# Patient Record
Sex: Male | Born: 1950 | Race: White | Hispanic: No | Marital: Married | Smoking: Never smoker
Health system: Southern US, Community
[De-identification: ages and names within clinical notes are randomized; demographics above are authoritative.]

## PROBLEM LIST (undated history)

## (undated) DIAGNOSIS — M1990 Unspecified osteoarthritis, unspecified site: Secondary | ICD-10-CM

## (undated) DIAGNOSIS — T8859XA Other complications of anesthesia, initial encounter: Secondary | ICD-10-CM

## (undated) DIAGNOSIS — G459 Transient cerebral ischemic attack, unspecified: Secondary | ICD-10-CM

## (undated) DIAGNOSIS — G6181 Chronic inflammatory demyelinating polyneuritis: Secondary | ICD-10-CM

## (undated) DIAGNOSIS — I10 Essential (primary) hypertension: Secondary | ICD-10-CM

## (undated) DIAGNOSIS — I2510 Atherosclerotic heart disease of native coronary artery without angina pectoris: Secondary | ICD-10-CM

## (undated) DIAGNOSIS — G4730 Sleep apnea, unspecified: Secondary | ICD-10-CM

## (undated) DIAGNOSIS — R51 Headache: Secondary | ICD-10-CM

## (undated) DIAGNOSIS — I480 Paroxysmal atrial fibrillation: Secondary | ICD-10-CM

## (undated) DIAGNOSIS — Z8719 Personal history of other diseases of the digestive system: Secondary | ICD-10-CM

## (undated) DIAGNOSIS — F99 Mental disorder, not otherwise specified: Secondary | ICD-10-CM

## (undated) DIAGNOSIS — F419 Anxiety disorder, unspecified: Secondary | ICD-10-CM

## (undated) DIAGNOSIS — G40909 Epilepsy, unspecified, not intractable, without status epilepticus: Secondary | ICD-10-CM

## (undated) DIAGNOSIS — Z950 Presence of cardiac pacemaker: Secondary | ICD-10-CM

## (undated) DIAGNOSIS — I639 Cerebral infarction, unspecified: Secondary | ICD-10-CM

## (undated) DIAGNOSIS — E1140 Type 2 diabetes mellitus with diabetic neuropathy, unspecified: Secondary | ICD-10-CM

## (undated) DIAGNOSIS — T4145XA Adverse effect of unspecified anesthetic, initial encounter: Secondary | ICD-10-CM

## (undated) DIAGNOSIS — K219 Gastro-esophageal reflux disease without esophagitis: Secondary | ICD-10-CM

## (undated) HISTORY — PX: INGUINAL HERNIA REPAIR: SHX194

## (undated) HISTORY — PX: OTHER SURGICAL HISTORY: SHX169

## (undated) HISTORY — PX: HERNIA REPAIR: SHX51

## (undated) HISTORY — PX: LUMBAR FUSION: SHX111

## (undated) HISTORY — PX: PERCUTANEOUS PINNING TOE FRACTURE: SUR1018

## (undated) HISTORY — PX: APPENDECTOMY: SHX54

---

## 1999-12-04 HISTORY — PX: CORONARY ARTERY BYPASS GRAFT: SHX141

## 2012-10-15 ENCOUNTER — Other Ambulatory Visit: Payer: Self-pay | Admitting: Neurosurgery

## 2012-10-28 ENCOUNTER — Encounter (HOSPITAL_COMMUNITY): Payer: Self-pay | Admitting: Pharmacy Technician

## 2012-11-03 ENCOUNTER — Ambulatory Visit (HOSPITAL_COMMUNITY)
Admission: RE | Admit: 2012-11-03 | Discharge: 2012-11-03 | Disposition: A | Discharge status: Home / Self Care | Payer: Managed Care, Other (non HMO) | Source: Ambulatory Visit | Attending: Anesthesiology | Admitting: Anesthesiology

## 2012-11-03 ENCOUNTER — Encounter (HOSPITAL_COMMUNITY)
Admission: RE | Admit: 2012-11-03 | Discharge: 2012-11-03 | Disposition: A | Discharge status: Home / Self Care | Payer: Managed Care, Other (non HMO) | Source: Ambulatory Visit | Attending: Neurosurgery | Admitting: Neurosurgery

## 2012-11-03 ENCOUNTER — Encounter (HOSPITAL_COMMUNITY): Payer: Self-pay

## 2012-11-03 DIAGNOSIS — Z01818 Encounter for other preprocedural examination: Secondary | ICD-10-CM | POA: Insufficient documentation

## 2012-11-03 HISTORY — DX: Adverse effect of unspecified anesthetic, initial encounter: T41.45XA

## 2012-11-03 HISTORY — DX: Anxiety disorder, unspecified: F41.9

## 2012-11-03 HISTORY — DX: Personal history of other diseases of the digestive system: Z87.19

## 2012-11-03 HISTORY — DX: Atherosclerotic heart disease of native coronary artery without angina pectoris: I25.10

## 2012-11-03 HISTORY — DX: Gastro-esophageal reflux disease without esophagitis: K21.9

## 2012-11-03 HISTORY — DX: Unspecified osteoarthritis, unspecified site: M19.90

## 2012-11-03 HISTORY — DX: Headache: R51

## 2012-11-03 HISTORY — DX: Other complications of anesthesia, initial encounter: T88.59XA

## 2012-11-03 HISTORY — DX: Mental disorder, not otherwise specified: F99

## 2012-11-03 LAB — CBC
HCT: 43.8 % (ref 39.0–52.0)
Hemoglobin: 15.4 g/dL (ref 13.0–17.0)
RDW: 12.6 % (ref 11.5–15.5)
WBC: 6.5 10*3/uL (ref 4.0–10.5)

## 2012-11-03 LAB — TYPE AND SCREEN
ABO/RH(D): O POS
Antibody Screen: NEGATIVE

## 2012-11-03 LAB — SURGICAL PCR SCREEN
MRSA, PCR: NEGATIVE
Staphylococcus aureus: NEGATIVE

## 2012-11-03 LAB — BASIC METABOLIC PANEL
Chloride: 104 mEq/L (ref 96–112)
GFR calc Af Amer: >90 mL/min (ref >90)
GFR calc non Af Amer: 86 mL/min — ABNORMAL LOW (ref >90)
Potassium: 3.8 mEq/L (ref 3.5–5.1)

## 2012-11-03 NOTE — Progress Notes (Signed)
Pt was seen by Dr Holley Raring in the past 3 weeks.  I faxed a request to [facility] requesting the EKG, office notes and any stress test or 2D echo.

## 2012-11-03 NOTE — Pre-Procedure Instructions (Addendum)
20 JEDI CATALFAMO  11/03/2012   Your procedure is scheduled on:  Monday, December 9th.  Report to [facility] at 5:30 AM.    Call this number if you have problems the morning of surgery: [phone]   Remember: Nothing to eat or drink after Midnight.    Take these medicines the morning of surgery with A SIP OF WATER: Clonazepam (Klonopin), Divalproex (Depakote), Escitalopram (Lexapro).   May take Hydrocodone- Acetaminophen (Norco) if needed.   Stop taking Aspirin,  Coumadin, Plavix, Effient and Herbal medications.  Don not take any NSAIDs ie: Ibuprofen,  Advil,Naproxen or any medication containing Aspirin (Exedrin Migraine).   Do not wear jewelry, make-up or nail polish.  Do not wear lotions, powders, or perfumes. You may wear deodorant.  Do not shave 48 hours prior to surgery. Men may shave face and neck.  Do not bring valuables to the hospital.  Contacts, dentures or bridgework may not be worn into surgery.  Leave suitcase in the car. After surgery it may be brought to your room.  For patients admitted to the hospital, checkout time is 11:00 AM the day of discharge.   Patients discharged the day of surgery will not be allowed to drive home.  Name and phone number of your driver: na    Special Instructions: Shower using CHG 2 nights before surgery and the night before surgery.  If you shower the day of surgery use CHG.  Use special wash - you have one bottle of CHG for all showers.  You should use approximately 1/3 of the bottle for each shower.   Please read over the following fact sheets that you were given: Pain Booklet, Coughing and Deep Breathing, Blood Transfusion Information and Surgical Site Infection Prevention

## 2012-11-09 MED ORDER — DEXAMETHASONE SODIUM PHOSPHATE 10 MG/ML IJ SOLN
10.0000 mg | INTRAMUSCULAR | Status: AC
  Administered 2012-11-10: 10 mg via INTRAVENOUS
  Filled 2012-11-09: qty 1

## 2012-11-09 MED ORDER — CEFAZOLIN SODIUM-DEXTROSE 2-3 GM-% IV SOLR
2.0000 g | INTRAVENOUS | Status: AC
  Administered 2012-11-10: 2 g via INTRAVENOUS
  Filled 2012-11-09: qty 50

## 2012-11-10 ENCOUNTER — Encounter (HOSPITAL_COMMUNITY): Payer: Self-pay | Admitting: Anesthesiology

## 2012-11-10 ENCOUNTER — Ambulatory Visit (HOSPITAL_COMMUNITY): Payer: Medicare Other

## 2012-11-10 ENCOUNTER — Ambulatory Visit (HOSPITAL_COMMUNITY): Payer: Medicare Other | Admitting: Anesthesiology

## 2012-11-10 ENCOUNTER — Encounter (HOSPITAL_COMMUNITY)
Admission: RE | Disposition: A | Discharge status: Home / Self Care | Payer: Self-pay | Source: Ambulatory Visit | Attending: Neurosurgery

## 2012-11-10 ENCOUNTER — Encounter (HOSPITAL_COMMUNITY): Payer: Self-pay | Admitting: *Deleted

## 2012-11-10 ENCOUNTER — Inpatient Hospital Stay (HOSPITAL_COMMUNITY)
Admission: RE | Admit: 2012-11-10 | Discharge: 2012-11-12 | DRG: 460 | Disposition: A | Discharge status: Home / Self Care | Payer: Medicare Other | Source: Ambulatory Visit | Attending: Neurosurgery | Admitting: Neurosurgery

## 2012-11-10 DIAGNOSIS — K219 Gastro-esophageal reflux disease without esophagitis: Secondary | ICD-10-CM | POA: Diagnosis present

## 2012-11-10 DIAGNOSIS — M5137 Other intervertebral disc degeneration, lumbosacral region: Principal | ICD-10-CM | POA: Diagnosis present

## 2012-11-10 DIAGNOSIS — M4316 Spondylolisthesis, lumbar region: Secondary | ICD-10-CM

## 2012-11-10 DIAGNOSIS — Z9861 Coronary angioplasty status: Secondary | ICD-10-CM

## 2012-11-10 DIAGNOSIS — F429 Obsessive-compulsive disorder, unspecified: Secondary | ICD-10-CM | POA: Diagnosis present

## 2012-11-10 DIAGNOSIS — E119 Type 2 diabetes mellitus without complications: Secondary | ICD-10-CM | POA: Diagnosis present

## 2012-11-10 DIAGNOSIS — F411 Generalized anxiety disorder: Secondary | ICD-10-CM | POA: Diagnosis present

## 2012-11-10 DIAGNOSIS — M129 Arthropathy, unspecified: Secondary | ICD-10-CM | POA: Diagnosis present

## 2012-11-10 DIAGNOSIS — Z7982 Long term (current) use of aspirin: Secondary | ICD-10-CM

## 2012-11-10 DIAGNOSIS — I2510 Atherosclerotic heart disease of native coronary artery without angina pectoris: Secondary | ICD-10-CM | POA: Diagnosis present

## 2012-11-10 DIAGNOSIS — Z79899 Other long term (current) drug therapy: Secondary | ICD-10-CM

## 2012-11-10 DIAGNOSIS — M51379 Other intervertebral disc degeneration, lumbosacral region without mention of lumbar back pain or lower extremity pain: Principal | ICD-10-CM | POA: Diagnosis present

## 2012-11-10 DIAGNOSIS — M4310 Spondylolisthesis, site unspecified: Secondary | ICD-10-CM | POA: Diagnosis present

## 2012-11-10 DIAGNOSIS — Z7902 Long term (current) use of antithrombotics/antiplatelets: Secondary | ICD-10-CM

## 2012-11-10 DIAGNOSIS — Z951 Presence of aortocoronary bypass graft: Secondary | ICD-10-CM

## 2012-11-10 LAB — GLUCOSE, CAPILLARY
Glucose-Capillary: 105 mg/dL — ABNORMAL HIGH (ref 70–99)
Glucose-Capillary: 154 mg/dL — ABNORMAL HIGH (ref 70–99)

## 2012-11-10 SURGERY — POSTERIOR LUMBAR FUSION 1 LEVEL
Anesthesia: General | Site: Back | Laterality: Bilateral | Wound class: Clean

## 2012-11-10 MED ORDER — EPHEDRINE SULFATE 50 MG/ML IJ SOLN
INTRAMUSCULAR | Status: DC | PRN
  Administered 2012-11-10: 5 mg via INTRAVENOUS

## 2012-11-10 MED ORDER — ONDANSETRON HCL 4 MG/2ML IJ SOLN
INTRAMUSCULAR | Status: DC | PRN
  Administered 2012-11-10: 4 mg via INTRAVENOUS

## 2012-11-10 MED ORDER — CLONAZEPAM 1 MG PO TABS
2.0000 mg | ORAL_TABLET | Freq: Two times a day (BID) | ORAL | Status: DC
  Administered 2012-11-10 – 2012-11-12 (×4): 2 mg via ORAL
  Filled 2012-11-10 (×4): qty 2

## 2012-11-10 MED ORDER — DEXAMETHASONE SODIUM PHOSPHATE 4 MG/ML IJ SOLN
4.0000 mg | Freq: Four times a day (QID) | INTRAMUSCULAR | Status: AC
  Administered 2012-11-10 (×2): 4 mg via INTRAVENOUS
  Filled 2012-11-10: qty 1

## 2012-11-10 MED ORDER — 0.9 % SODIUM CHLORIDE (POUR BTL) OPTIME
TOPICAL | Status: DC | PRN
  Administered 2012-11-10: 1000 mL

## 2012-11-10 MED ORDER — PANTOPRAZOLE SODIUM 40 MG IV SOLR
40.0000 mg | Freq: Every day | INTRAVENOUS | Status: DC
  Administered 2012-11-10: 40 mg via INTRAVENOUS
  Filled 2012-11-10 (×2): qty 40

## 2012-11-10 MED ORDER — GLYCOPYRROLATE 0.2 MG/ML IJ SOLN
INTRAMUSCULAR | Status: DC | PRN
  Administered 2012-11-10: 0.4 mg via INTRAVENOUS
  Administered 2012-11-10: 0.3 mg via INTRAVENOUS

## 2012-11-10 MED ORDER — ACETAMINOPHEN 650 MG RE SUPP: 650.0000 mg | RECTAL | Status: DC | PRN

## 2012-11-10 MED ORDER — SODIUM CHLORIDE 0.9 % IJ SOLN: 3.0000 mL | INTRAMUSCULAR | Status: DC | PRN

## 2012-11-10 MED ORDER — ROCURONIUM BROMIDE 100 MG/10ML IV SOLN
INTRAVENOUS | Status: DC | PRN
  Administered 2012-11-10 (×3): 10 mg via INTRAVENOUS
  Administered 2012-11-10: 50 mg via INTRAVENOUS
  Administered 2012-11-10 (×3): 10 mg via INTRAVENOUS

## 2012-11-10 MED ORDER — BACITRACIN 50000 UNITS IM SOLR
INTRAMUSCULAR | Status: AC
  Filled 2012-11-10: qty 1

## 2012-11-10 MED ORDER — DIVALPROEX SODIUM 500 MG PO DR TAB
750.0000 mg | DELAYED_RELEASE_TABLET | Freq: Two times a day (BID) | ORAL | Status: DC
  Administered 2012-11-10 – 2012-11-12 (×4): 750 mg via ORAL
  Filled 2012-11-10 (×6): qty 1

## 2012-11-10 MED ORDER — MIDAZOLAM HCL 5 MG/5ML IJ SOLN
INTRAMUSCULAR | Status: DC | PRN
  Administered 2012-11-10 (×2): 1 mg via INTRAVENOUS

## 2012-11-10 MED ORDER — BACITRACIN ZINC 500 UNIT/GM EX OINT: TOPICAL_OINTMENT | CUTANEOUS | Status: DC | PRN

## 2012-11-10 MED ORDER — KCL IN DEXTROSE-NACL 20-5-0.45 MEQ/L-%-% IV SOLN
80.0000 mL/h | INTRAVENOUS | Status: DC
  Administered 2012-11-10: 80 mL/h via INTRAVENOUS
  Filled 2012-11-10 (×5): qty 1000

## 2012-11-10 MED ORDER — HYDROMORPHONE HCL PF 1 MG/ML IJ SOLN
1.0000 mg | INTRAMUSCULAR | Status: DC | PRN
  Administered 2012-11-10: 1 mg via INTRAMUSCULAR
  Filled 2012-11-10: qty 1

## 2012-11-10 MED ORDER — BUPIVACAINE HCL (PF) 0.5 % IJ SOLN
INTRAMUSCULAR | Status: DC | PRN
  Administered 2012-11-10: 30 mL

## 2012-11-10 MED ORDER — NEOSTIGMINE METHYLSULFATE 1 MG/ML IJ SOLN
INTRAMUSCULAR | Status: DC | PRN
  Administered 2012-11-10: 2 mg via INTRAVENOUS
  Administered 2012-11-10: 3 mg via INTRAVENOUS

## 2012-11-10 MED ORDER — THROMBIN 20000 UNITS EX SOLR
CUTANEOUS | Status: DC | PRN
  Administered 2012-11-10: 08:00:00 via TOPICAL

## 2012-11-10 MED ORDER — HYDROMORPHONE HCL PF 1 MG/ML IJ SOLN
INTRAMUSCULAR | Status: AC
  Filled 2012-11-10: qty 1

## 2012-11-10 MED ORDER — ATORVASTATIN CALCIUM 10 MG PO TABS
10.0000 mg | ORAL_TABLET | Freq: Every day | ORAL | Status: DC
  Administered 2012-11-10 – 2012-11-11 (×2): 10 mg via ORAL
  Filled 2012-11-10 (×3): qty 1

## 2012-11-10 MED ORDER — ARTIFICIAL TEARS OP OINT
TOPICAL_OINTMENT | OPHTHALMIC | Status: DC | PRN
  Administered 2012-11-10: 1 via OPHTHALMIC

## 2012-11-10 MED ORDER — SODIUM CHLORIDE 0.9 % IR SOLN
Status: DC | PRN
  Administered 2012-11-10: 08:00:00

## 2012-11-10 MED ORDER — MENTHOL 3 MG MT LOZG: 1.0000 | LOZENGE | OROMUCOSAL | Status: DC | PRN

## 2012-11-10 MED ORDER — HYDROCODONE-ACETAMINOPHEN 5-325 MG PO TABS
1.0000 | ORAL_TABLET | ORAL | Status: DC | PRN
  Administered 2012-11-10: 2 via ORAL
  Administered 2012-11-11: 1 via ORAL
  Administered 2012-11-11 (×3): 2 via ORAL
  Administered 2012-11-12: 1 via ORAL
  Administered 2012-11-12 (×2): 2 via ORAL
  Filled 2012-11-10 (×8): qty 2

## 2012-11-10 MED ORDER — ACETAMINOPHEN 325 MG PO TABS: 650.0000 mg | ORAL_TABLET | ORAL | Status: DC | PRN

## 2012-11-10 MED ORDER — SODIUM CHLORIDE 0.9 % IV SOLN
INTRAVENOUS | Status: AC
  Filled 2012-11-10: qty 500

## 2012-11-10 MED ORDER — OXYCODONE HCL 5 MG/5ML PO SOLN: 5.0000 mg | Freq: Once | ORAL | Status: DC | PRN

## 2012-11-10 MED ORDER — HYDROMORPHONE HCL PF 1 MG/ML IJ SOLN
0.2500 mg | INTRAMUSCULAR | Status: DC | PRN
  Administered 2012-11-10 (×6): 0.5 mg via INTRAVENOUS

## 2012-11-10 MED ORDER — CEFAZOLIN SODIUM-DEXTROSE 2-3 GM-% IV SOLR
2.0000 g | Freq: Three times a day (TID) | INTRAVENOUS | Status: AC
  Administered 2012-11-10 – 2012-11-11 (×3): 2 g via INTRAVENOUS
  Filled 2012-11-10 (×3): qty 50

## 2012-11-10 MED ORDER — HYDROMORPHONE HCL PF 1 MG/ML IJ SOLN
INTRAMUSCULAR | Status: AC
  Filled 2012-11-10: qty 2

## 2012-11-10 MED ORDER — PHENYLEPHRINE HCL 10 MG/ML IJ SOLN
10.0000 mg | INTRAVENOUS | Status: DC | PRN
  Administered 2012-11-10: 20 ug/min via INTRAVENOUS

## 2012-11-10 MED ORDER — DEXAMETHASONE 4 MG PO TABS
4.0000 mg | ORAL_TABLET | Freq: Four times a day (QID) | ORAL | Status: AC
  Filled 2012-11-10 (×2): qty 1

## 2012-11-10 MED ORDER — ALBUMIN HUMAN 5 % IV SOLN
INTRAVENOUS | Status: DC | PRN
  Administered 2012-11-10: 10:00:00 via INTRAVENOUS

## 2012-11-10 MED ORDER — LACTATED RINGERS IV SOLN
INTRAVENOUS | Status: DC | PRN
  Administered 2012-11-10 (×2): via INTRAVENOUS

## 2012-11-10 MED ORDER — LIDOCAINE HCL (CARDIAC) 20 MG/ML IV SOLN
INTRAVENOUS | Status: DC | PRN
  Administered 2012-11-10: 100 mg via INTRAVENOUS

## 2012-11-10 MED ORDER — OXYCODONE HCL 5 MG PO TABS: 5.0000 mg | ORAL_TABLET | Freq: Once | ORAL | Status: DC | PRN

## 2012-11-10 MED ORDER — PROPOFOL 10 MG/ML IV BOLUS
INTRAVENOUS | Status: DC | PRN
  Administered 2012-11-10: 200 mg via INTRAVENOUS

## 2012-11-10 MED ORDER — PHENOL 1.4 % MT LIQD: 1.0000 | OROMUCOSAL | Status: DC | PRN

## 2012-11-10 MED ORDER — SODIUM CHLORIDE 0.9 % IV SOLN: 250.0000 mL | INTRAVENOUS | Status: DC

## 2012-11-10 MED ORDER — FENTANYL CITRATE 0.05 MG/ML IJ SOLN
INTRAMUSCULAR | Status: DC | PRN
  Administered 2012-11-10: 25 ug via INTRAVENOUS
  Administered 2012-11-10: 50 ug via INTRAVENOUS
  Administered 2012-11-10: 25 ug via INTRAVENOUS
  Administered 2012-11-10: 150 ug via INTRAVENOUS

## 2012-11-10 MED ORDER — DEXTROSE 5 % IV SOLN
INTRAVENOUS | Status: DC | PRN
  Administered 2012-11-10: 08:00:00 via INTRAVENOUS

## 2012-11-10 MED ORDER — PROMETHAZINE HCL 25 MG/ML IJ SOLN: 6.2500 mg | INTRAMUSCULAR | Status: DC | PRN

## 2012-11-10 MED ORDER — ONDANSETRON HCL 4 MG/2ML IJ SOLN: 4.0000 mg | INTRAMUSCULAR | Status: DC | PRN

## 2012-11-10 MED ORDER — ESCITALOPRAM OXALATE 10 MG PO TABS
10.0000 mg | ORAL_TABLET | Freq: Every morning | ORAL | Status: DC
  Administered 2012-11-11 – 2012-11-12 (×2): 10 mg via ORAL
  Filled 2012-11-10 (×2): qty 1

## 2012-11-10 MED ORDER — SODIUM CHLORIDE 0.9 % IJ SOLN
3.0000 mL | Freq: Two times a day (BID) | INTRAMUSCULAR | Status: DC
  Administered 2012-11-11 – 2012-11-12 (×3): 3 mL via INTRAVENOUS

## 2012-11-10 SURGICAL SUPPLY — 66 items
BAG DECANTER FOR FLEXI CONT (MISCELLANEOUS) ×2 IMPLANT
BENZOIN TINCTURE PRP APPL 2/3 (GAUZE/BANDAGES/DRESSINGS) ×2 IMPLANT
BLADE SURG ROTATE 9660 (MISCELLANEOUS) ×2 IMPLANT
BONE EQUIVA 10CC (Bone Implant) ×2 IMPLANT
BRUSH SCRUB EZ PLAIN DRY (MISCELLANEOUS) ×2 IMPLANT
CANISTER SUCTION 2500CC (MISCELLANEOUS) ×2 IMPLANT
CLOTH BEACON ORANGE TIMEOUT ST (SAFETY) ×2 IMPLANT
CONT SPEC 4OZ CLIKSEAL STRL BL (MISCELLANEOUS) ×4 IMPLANT
COVER BACK TABLE 24X17X13 BIG (DRAPES) IMPLANT
COVER TABLE BACK 60X90 (DRAPES) ×2 IMPLANT
DERMABOND ADVANCED (GAUZE/BANDAGES/DRESSINGS) ×1
DERMABOND ADVANCED .7 DNX12 (GAUZE/BANDAGES/DRESSINGS) ×1 IMPLANT
DRAPE C-ARM 42X72 X-RAY (DRAPES) ×6 IMPLANT
DRAPE LAPAROTOMY 100X72X124 (DRAPES) ×2 IMPLANT
DRAPE SURG 17X23 STRL (DRAPES) ×4 IMPLANT
DRESSING TELFA 8X3 (GAUZE/BANDAGES/DRESSINGS) ×2 IMPLANT
DURAPREP 26ML APPLICATOR (WOUND CARE) ×2 IMPLANT
ELECT REM PT RETURN 9FT ADLT (ELECTROSURGICAL) ×2
ELECTRODE REM PT RTRN 9FT ADLT (ELECTROSURGICAL) ×1 IMPLANT
EVACUATOR 1/8 PVC DRAIN (DRAIN) ×2 IMPLANT
GAUZE SPONGE 4X4 16PLY XRAY LF (GAUZE/BANDAGES/DRESSINGS) ×2 IMPLANT
GLOVE BIO SURGEON STRL SZ8.5 (GLOVE) ×2 IMPLANT
GLOVE BIOGEL PI IND STRL 6.5 (GLOVE) ×2 IMPLANT
GLOVE BIOGEL PI INDICATOR 6.5 (GLOVE) ×2
GLOVE ECLIPSE 7.5 STRL STRAW (GLOVE) ×4 IMPLANT
GLOVE EXAM NITRILE LRG STRL (GLOVE) IMPLANT
GLOVE EXAM NITRILE MD LF STRL (GLOVE) ×4 IMPLANT
GLOVE EXAM NITRILE XS STR PU (GLOVE) IMPLANT
GLOVE SS BIOGEL STRL SZ 8 (GLOVE) ×1 IMPLANT
GLOVE SUPERSENSE BIOGEL SZ 8 (GLOVE) ×1
GLOVE SURG SS PI 6.5 STRL IVOR (GLOVE) ×8 IMPLANT
GOWN BRE IMP SLV AUR LG STRL (GOWN DISPOSABLE) IMPLANT
GOWN BRE IMP SLV AUR XL STRL (GOWN DISPOSABLE) ×10 IMPLANT
GOWN STRL REIN 2XL LVL4 (GOWN DISPOSABLE) IMPLANT
KIT BASIN OR (CUSTOM PROCEDURE TRAY) ×2 IMPLANT
KIT ROOM TURNOVER OR (KITS) ×2 IMPLANT
LUCENT ST 10X22X9 (Cage) ×4 IMPLANT
LUCENT STRAIGHT CAGE 10X22X9MM IMPLANT
MARKER SKIN DUAL TIP RULER LAB (MISCELLANEOUS) ×2 IMPLANT
NEEDLE HYPO 22GX1.5 SAFETY (NEEDLE) ×4 IMPLANT
NS IRRIG 1000ML POUR BTL (IV SOLUTION) ×2 IMPLANT
PACK LAMINECTOMY NEURO (CUSTOM PROCEDURE TRAY) ×2 IMPLANT
PAD ARMBOARD 7.5X6 YLW CONV (MISCELLANEOUS) ×6 IMPLANT
PATTIES SURGICAL .75X.75 (GAUZE/BANDAGES/DRESSINGS) IMPLANT
ROD PER BENT 35MM (Rod) ×4 IMPLANT
SCREW POLY 6.5X40MM (Screw) ×2 IMPLANT
SCREW POLY 6.5X45 (Screw) ×1 IMPLANT
SCREW POLY 6.5X45MM (Screw) ×1 IMPLANT
SCREW SEQUOIA 6.5X35MM (Screw) ×4 IMPLANT
SPONGE GAUZE 4X4 12PLY (GAUZE/BANDAGES/DRESSINGS) ×2 IMPLANT
SPONGE LAP 4X18 X RAY DECT (DISPOSABLE) IMPLANT
SPONGE SURGIFOAM ABS GEL 100 (HEMOSTASIS) ×2 IMPLANT
STRIP CLOSURE SKIN 1/2X4 (GAUZE/BANDAGES/DRESSINGS) ×2 IMPLANT
SUT VIC AB 0 CT1 18XCR BRD8 (SUTURE) ×1 IMPLANT
SUT VIC AB 0 CT1 8-18 (SUTURE) ×1
SUT VIC AB 2-0 OS6 18 (SUTURE) ×6 IMPLANT
SUT VIC AB 3-0 CP2 18 (SUTURE) ×2 IMPLANT
SYR 20ML ECCENTRIC (SYRINGE) ×2 IMPLANT
TOOL FLUTED BALL 7MM (MISCELLANEOUS) ×2 IMPLANT
TOOL MATCHSTK 3MM (MISCELLANEOUS) ×2 IMPLANT
TOP CLSR SEQUOIA (Orthopedic Implant) ×8 IMPLANT
TOWEL OR 17X24 6PK STRL BLUE (TOWEL DISPOSABLE) ×2 IMPLANT
TOWEL OR 17X26 10 PK STRL BLUE (TOWEL DISPOSABLE) ×2 IMPLANT
TRAP SPECIMEN MUCOUS 40CC (MISCELLANEOUS) ×2 IMPLANT
TRAY FOLEY CATH 14FRSI W/METER (CATHETERS) ×2 IMPLANT
WATER STERILE IRR 1000ML POUR (IV SOLUTION) ×2 IMPLANT

## 2012-11-10 NOTE — Preoperative (Signed)
Beta Blockers   Reason not to administer Beta Blockers:Not Applicable 

## 2012-11-10 NOTE — H&P (Signed)
Edward Arellano is an 61 y.o. male.   Chief Complaint: Back and left leg pain HPI: The patient is 61 year old gentleman who presented with back and left lower extremity discomfort. He was tried on aggressive conservative therapy which gave her no relief. 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 After failing additional conservative therapy the patient requested surgery and now comes for an L5-S1 posterior lumbar interbody fusion with pedicle screw fixation. I had a long discussion with him regarding the risks and benefits of surgical intervention. The risks discussed include but are not limited to bleeding infection weakness numbness paralysis spinal fluid leak trouble with instrumentation nonunion coma and death. We have discussed alternative methods of therapy offered risks and benefits of nonintervention. He has had the opportunity to ask numerous questions and appears to understand. With this information in hand he has requested to proceed with surgery.  Past Medical History  Diagnosis Date  . Complication of anesthesia     Takes alot to sedate patient.  . Coronary artery disease   . Diabetes mellitus without complication     A1C 5.0  . Anxiety   . H/O hiatal hernia     corrected  . GERD (gastroesophageal reflux disease)     corrected with Hital Hernia  . Headache     sees a neuroologist- Dr. Celine Mans  . Arthritis   . Mental disorder     OCD.       Past Surgical History  Procedure Date  . Appendectomy   . Hernia repair   . Coronary artery bypass graft 2001  . Inguinal hernia repair     Right and Left  . Colonscopy   . Percutaneous pinning toe fracture     right 5th toe    History reviewed. No pertinent family history. Social History:  reports that he has never smoked. He does not have any smokeless tobacco history on file. He reports that he does not drink alcohol or use illicit  drugs.  Allergies: No Known Allergies  Medications Prior to Admission  Medication Sig Dispense Refill  . acetaminophen (TYLENOL) 650 MG CR tablet Take 650 mg by mouth 2 (two) times daily as needed. For pain      . aspirin-acetaminophen-caffeine (EXCEDRIN MIGRAINE) 250-250-65 MG per tablet Take 1 tablet by mouth every 6 (six) hours as needed. For pain      . clonazePAM (KLONOPIN) 1 MG tablet Take 2 mg by mouth 2 (two) times daily. Name brand only      . divalproex (DEPAKOTE) 500 MG DR tablet Take 750 mg by mouth 2 (two) times daily.      [name] escitalopram (LEXAPRO) 10 MG tablet Take 10 mg by mouth every morning.      . folic acid (FOLVITE) 1 MG tablet Take 1 mg by mouth every morning.      [name] HYDROcodone-acetaminophen (NORCO) 7.5-325 MG per tablet Take 2 tablets by mouth 2 (two) times daily.      . Multiple Vitamin (MULTIVITAMIN WITH MINERALS) TABS Take 1 tablet by mouth every morning.      . niacin 500 MG CR capsule Take 1,500 mg by mouth at bedtime.      . rosuvastatin (CRESTOR) 10 MG tablet Take 10 mg by mouth every evening.      [name] aspirin EC 81 MG tablet Take 81 mg by mouth every morning.      . clopidogrel (PLAVIX) 75 MG tablet Take  75 mg by mouth every morning.        Results for orders placed during the hospital encounter of 11/10/12 (from the past 48 hour(s))  GLUCOSE, CAPILLARY     Status: Abnormal   Collection Time   11/10/12  6:20 AM      Component Value Range Comment   Glucose-Capillary 105 (*) 70 - 99 mg/dL    No results found.  A comprehensive review of systems was negative.  Blood pressure 139/85, pulse 58, temperature 97.6 F (36.4 C), temperature source Oral, resp. rate 18, SpO2 99.00%.  The patient is awake alert and oriented. His no facial asymmetry. His gait is slow but nonantalgic. His reflexes are decreased but equal. He has normal strength and sensation. 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 The plan is  for an L5-S1 decompression with interbody fusion and pedicle screw fixation.  Reinaldo Meeker, MD 11/10/2012, 7:37 AM

## 2012-11-10 NOTE — Anesthesia Procedure Notes (Signed)
Procedure Name: Intubation Date/Time: 11/10/2012 7:49 AM Performed by: Neomia Dear, Osvaldo Lamping K Pre-anesthesia Checklist: Patient being monitored, Suction available, Emergency Drugs available, Patient identified and Timeout performed Patient Re-evaluated:Patient Re-evaluated prior to inductionOxygen Delivery Method: Circle system utilized Preoxygenation: Pre-oxygenation with 100% oxygen Intubation Type: IV induction Ventilation: Mask ventilation without difficulty Laryngoscope Size: Mac and 3 Grade View: Grade I Tube type: Oral Tube size: 8.5 mm Number of attempts: 1 Airway Equipment and Method: Stylet Placement Confirmation: ETT inserted through vocal cords under direct vision,  breath sounds checked- equal and bilateral and positive ETCO2 Secured at: 24 cm Tube secured with: Tape Dental Injury: Teeth and Oropharynx as per pre-operative assessment

## 2012-11-10 NOTE — Anesthesia Preprocedure Evaluation (Addendum)
Anesthesia Evaluation  Patient identified by MRN, date of birth, ID band Patient awake    Reviewed: Allergy & Precautions, H&P , NPO status , Patient's Chart, lab work & pertinent test results  Airway Mallampati: II TM Distance: >3 FB Neck ROM: Full    Dental  (+) Teeth Intact and Dental Advisory Given   Pulmonary neg pulmonary ROS,    Pulmonary exam normal       Cardiovascular + CAD, + Cardiac Stents and + CABG     Neuro/Psych  Headaches, PSYCHIATRIC DISORDERS Anxiety    GI/Hepatic Neg liver ROS, hiatal hernia, GERD-  Controlled,  Endo/Other  neg diabetes  Renal/GU negative Renal ROS     Musculoskeletal negative musculoskeletal ROS (+)   Abdominal   Peds  Hematology negative hematology ROS (+)   Anesthesia Other Findings   Reproductive/Obstetrics                         Anesthesia Physical Anesthesia Plan  ASA: III  Anesthesia Plan: General   Post-op Pain Management:    Induction: Intravenous  Airway Management Planned: Oral ETT  Additional Equipment:   Intra-op Plan:   Post-operative Plan: Extubation in OR  Informed Consent: I have reviewed the patients History and Physical, chart, labs and discussed the procedure including the risks, benefits and alternatives for the proposed anesthesia with the patient or authorized representative who has indicated his/her understanding and acceptance.   Dental advisory given  Plan Discussed with: Anesthesiologist, Surgeon and CRNA  Anesthesia Plan Comments:        Anesthesia Quick Evaluation

## 2012-11-10 NOTE — Op Note (Signed)
Preop diagnosis: Spondylolisthesis with stenosis L5-S1 with L5 and S1 nerve root compression bilaterally Postop diagnosis: Same Procedure: Bilateral decompressive L5-S1 laminectomy with decompression of L5 and S1 nerve roots more so than needed for interbody fusion followed by L5-S1 posterior lumbar interbody fusion with peek interbody spacers followed by L5-S1 posterolateral fusion followed by L5-S1 nonsegmental instrumentation with Sequoia pedicle screw system Surgeon: Regis Wiland Assistant: Lovell Sheehan  Abdomen place the prone position the patient's back was prepped and draped in the usual sterile fashion. Localizing x-ray was taken prior to incision to identify the appropriate level. Midline incision was made above the spinous processes of L4-L5 and S1. Using the Bovie cutting current the incision was carried on the spinous processes. Subperiosteal dissection was then carried out bilaterally on the spinous processes lamina and facet joint and to the far lateral region to identify the far lateral aspect of the sacrum and the transverse process of L5. Self-retaining tract was placed for exposure and x-ray showed approach the appropriate level. Using the Leksell rongeur spinous process of L5 and S1 removed. Starting the patient's left side generous laminotomy was performed removing the inferior 80% of the L5 lamina the medial three quarters of the facet joint. Pars defects were identified and the a free piece of bony overgrowth was removed to decompress the L5 and S1 nerve root more so than needed for interbody fusion. Summary compression was then carried out on the opposite side. Once again the pannus of bone was removed to decompress the L5 and S1 nerve roots. At this time inspection was carried out in all directions the evidence of residual compression and none could be identified. The disc space was then slowly distracted up to a 9 mm size. This was quite difficult as the bones were basically in contact prior to  this distraction. We are able to get a 9 mm a spacer and on the first side. We then did the same on the opposite side decorticated the endplates. We placed a bony shavings and morselized allograft in the midline to help with interbody fusion and then placed a second space without difficulty. Proximal we showed the spacers to be in good position. We then placed pedicle screws with Sequoia pedicle screw system. We drilled entry point then passed the pedicle all followed by tapping with a 5.5 mm tap and placed 6.5 x 40 mm screws at L5 and 6.5 x 35 Miller screws at S1. We then decorticated the far lateral region and placed a mixture of morselized allograft and autologous bone help with a posterior lateral fusion. We then chose appropriate length rod and secured to the top of the screws and then did tightening and final tightening with torque and counter torque. Final fluoroscopy AP lateral direction looked excellent. We then irrigated the wound copiously metabolic irrigation controlled any bleeding with upper coagulation Gelfoam. Left an epidural drain in the epidural space and brought out through separate stab incision. The was then closed in multiple layers of Vicryl on the muscle fascia subcutaneous and subcuticular tissues. Dermabond Steri-Strips were placed on the skin. Shortness was then applied the patient was extubated to recovery in stable condition.

## 2012-11-10 NOTE — Transfer of Care (Signed)
Immediate Anesthesia Transfer of Care Note  Patient: Edward Arellano  Procedure(s) Performed: Procedure(s) (LRB) with comments: POSTERIOR LUMBAR FUSION 1 LEVEL (Bilateral) - Lumbar five sacral one Posterior lumbar interbody fusion with pedicle screws  Patient Location: PACU  Anesthesia Type:General  Level of Consciousness: awake and oriented  Airway & Oxygen Therapy: Patient Spontanous Breathing and Patient connected to nasal cannula oxygen  Post-op Assessment: Report given to PACU RN, Post -op Vital signs reviewed and stable and Patient moving all extremities  Post vital signs: Reviewed and stable  Complications: No apparent anesthesia complications

## 2012-11-10 NOTE — Anesthesia Postprocedure Evaluation (Signed)
Anesthesia Post Note  Patient: Edward Arellano  Procedure(s) Performed: Procedure(s) (LRB): POSTERIOR LUMBAR FUSION 1 LEVEL (Bilateral)  Anesthesia type: general  Patient location: PACU  Post pain: Pain level controlled  Post assessment: Patient's Cardiovascular Status Stable  Last Vitals:  Filed Vitals:   11/10/12 1215  BP:   Pulse: 97  Temp:   Resp: 16    Post vital signs: Reviewed and stable  Level of consciousness: sedated  Complications: No apparent anesthesia complications

## 2012-11-11 LAB — GLUCOSE, CAPILLARY
Glucose-Capillary: 149 mg/dL — ABNORMAL HIGH (ref 70–99)
Glucose-Capillary: 153 mg/dL — ABNORMAL HIGH (ref 70–99)
Glucose-Capillary: 135 mg/dL — ABNORMAL HIGH (ref 70–99)

## 2012-11-11 MED ORDER — INFLUENZA VIRUS VACC SPLIT PF IM SUSP
0.5000 mL | INTRAMUSCULAR | Status: AC
  Administered 2012-11-12: 0.5 mL via INTRAMUSCULAR
  Filled 2012-11-11: qty 0.5

## 2012-11-11 MED ORDER — PANTOPRAZOLE SODIUM 40 MG PO TBEC
40.0000 mg | DELAYED_RELEASE_TABLET | Freq: Every day | ORAL | Status: DC
  Administered 2012-11-11 – 2012-11-12 (×2): 40 mg via ORAL
  Filled 2012-11-11 (×2): qty 1

## 2012-11-11 MED ORDER — DOCUSATE SODIUM 100 MG PO CAPS
100.0000 mg | ORAL_CAPSULE | Freq: Two times a day (BID) | ORAL | Status: DC
  Administered 2012-11-11 – 2012-11-12 (×2): 100 mg via ORAL
  Filled 2012-11-11 (×2): qty 1

## 2012-11-11 NOTE — Progress Notes (Signed)
Hemovac removed @ 1600. Pt tolerated well.  Site clean dry intact with minimal bleeding.

## 2012-11-11 NOTE — Progress Notes (Signed)
Patient walked early morning with a walker. Tolerated well. Foley d/cd

## 2012-11-11 NOTE — Progress Notes (Signed)
Patient ID: Edward Arellano, male   DOB: 21-Sep-1951, 61 y.o.   MRN: [id number] Subjective: Patient reports incisional; pain only  Objective: Vital signs in last 24 hours: Temp:  [97.7 F (36.5 C)-98.2 F (36.8 C)] 97.7 F (36.5 C) (12/10 1000) Pulse Rate:  [83-105] 83  (12/10 1000) Resp:  [17-20] 17  (12/10 1000) BP: (103-120)/(62-80) 117/80 mmHg (12/10 1000) SpO2:  [94 %-96 %] 94 % (12/10 1000) Weight:  [103.42 kg (228 lb)] 103.42 kg (228 lb) (12/10 4098)  Intake/Output from previous day: 12/09 0701 - 12/10 0700 In: 3169.3 [P.O.:360; I.V.:2559.3; IV Piggyback:250] Out: 3150 [Urine:2550; Drains:250; Blood:350] Intake/Output this shift: Total I/O In: -  Out: 475 [Urine:475]  Wound:clean and dry  Lab Results: No results found for this basename: WBC:2,HGB:2,HCT:2,PLT:2 in the last 72 hours BMET No results found for this basename: NA:2,K:2,CL:2,CO2:2,GLUCOSE:2,BUN:2,CREATININE:2,CALCIUM:2 in the last 72 hours  Studies/Results: Dg Lumbar Spine 2-3 Views  11/10/2012  *RADIOLOGY REPORT*  Clinical Data: Low back pain.  DG C-ARM 61-120 MIN,LUMBAR SPINE - 2-3 VIEW  Comparison: None.  Findings: C-arm films document pedicle screw and interbody cage placement in preparation for L5-S1 fusion.  No adverse features.  IMPRESSION: As above.   Original Report Authenticated By: Davonna Belling, M.D.    Dg C-arm 61-120 Min  11/10/2012  *RADIOLOGY REPORT*  Clinical Data: Low back pain.  DG C-ARM 61-120 MIN,LUMBAR SPINE - 2-3 VIEW  Comparison: None.  Findings: C-arm films document pedicle screw and interbody cage placement in preparation for L5-S1 fusion.  No adverse features.  IMPRESSION: As above.   Original Report Authenticated By: Davonna Belling, M.D.     Assessment/Plan: Doing well POD 1. Increasing activity. Will continue preswent rx.  LOS: 1 day  as above   Reinaldo Meeker, MD 11/11/2012, 2:36 PM

## 2012-11-11 NOTE — Progress Notes (Signed)
Pt foley d/c at 0600. Pt voided 225cc at 1015 and has no c/o urge to void or voiding problems.  Will continue to monitor.

## 2012-11-11 NOTE — Progress Notes (Signed)
Ambulated pt with walker and brace around the unit x 2.  Pt tolerated well. Resting now with call bell within reach.

## 2012-11-12 MED ORDER — HYDROCODONE-ACETAMINOPHEN 5-325 MG PO TABS: 1.0000 | ORAL_TABLET | ORAL | Status: DC | PRN

## 2012-11-12 NOTE — Discharge Summary (Signed)
Physician Discharge Summary  Patient ID: CARSTEN CARSTARPHEN MRN: [id number] DOB/AGE: Jul 18, 1951 61 y.o.  Admit date: 11/10/2012 Discharge date: 11/12/2012  Admission Diagnoses:  Discharge Diagnoses:  Active Problems:  * No active hospital problems. *    Discharged Condition: good  Hospital Course: Surgery Mopnday. Did well. Pain much improved. Increased activity well. Wound fine. Home POD 2. Specific instructions given.  Consults: None  Significant Diagnostic Studies: none  Treatments: surgery: L5 S1 PLIF  Discharge Exam: Blood pressure 106/66, pulse 62, temperature 97.5 F (36.4 C), temperature source Oral, resp. rate 18, height 6\' 2"  (1.88 m), weight 103.42 kg (228 lb), SpO2 97.00%. Incision/Wound:healing well  Disposition: Final discharge disposition not confirmed  Discharge Orders    Future Orders Please Complete By Expires   Diet general      Discharge instructions      Comments:   Mostly bedrest. Get up 9 or 10 times each day and walk for 15-20 minutes each time. Very little sitting the first week. No riding in the car until your first post op appointment. If you had neck surgery...may shower from the chest down. If you had low back surgery....you may shower with a saran wrap covering over the incision. Take your pain medicine as needed...and other medicines that you are instructed to take. Call for an appointment...[phone].   Call MD for:  temperature >100.4      Call MD for:  persistant nausea and vomiting      Call MD for:  severe uncontrolled pain      Call MD for:  redness, tenderness, or signs of infection (pain, swelling, redness, odor or green/yellow discharge around incision site)      Call MD for:  difficulty breathing, headache or visual disturbances      Call MD for:  hives          Medication List     As of 11/12/2012 10:01 AM    STOP taking these medications         aspirin EC 81 MG tablet      aspirin-acetaminophen-caffeine 250-250-65 MG per  tablet   Commonly known as: EXCEDRIN MIGRAINE      clopidogrel 75 MG tablet   Commonly known as: PLAVIX      folic acid 1 MG tablet   Commonly known as: FOLVITE      HYDROcodone-acetaminophen 7.5-325 MG per tablet   Commonly known as: NORCO      TAKE these medications         acetaminophen 650 MG CR tablet   Commonly known as: TYLENOL   Take 650 mg by mouth 2 (two) times daily as needed. For pain      divalproex 500 MG DR tablet   Commonly known as: DEPAKOTE   Take 750 mg by mouth 2 (two) times daily.      escitalopram 10 MG tablet   Commonly known as: LEXAPRO   Take 10 mg by mouth every morning.      HYDROcodone-acetaminophen 5-325 MG per tablet   Commonly known as: NORCO/VICODIN   Take 1-2 tablets by mouth every 4 (four) hours as needed.      KLONOPIN 1 MG tablet   Generic drug: clonazePAM   Take 2 mg by mouth 2 (two) times daily. Name brand only      multivitamin with minerals Tabs   Take 1 tablet by mouth every morning.      niacin 500 MG CR capsule   Take 1,500 mg by  mouth at bedtime.      rosuvastatin 10 MG tablet   Commonly known as: CRESTOR   Take 10 mg by mouth every evening.         At home rest most of the time. Get up 9 or 10 times each day and take a 15 or 20 minute walk. No riding in the car and to your first postoperative appointment. If you have neck surgery you may shower from the chest down starting on the third postoperative day. If you had back surgery he may start showering on the third postoperative day with saran wrap wrapped around your incisional area 3 times. After the shower remove the saran wrap. Take pain medicine as needed and other medications as instructed. Call my office for an appointment.  SignedReinaldo Meeker, MD 11/12/2012, 10:01 AM

## 2012-11-12 NOTE — Care Management Note (Signed)
    Page 1 of 1   11/12/2012     12:33:32 PM   CARE MANAGEMENT NOTE 11/12/2012  Patient:  Edward Arellano, Edward Arellano   Account Number:  [id number]  Date Initiated:  11/10/2012  Documentation initiated by:  Jacquelynn Cree  Subjective/Objective Assessment:   Admitted postop L1-S5 PLIF     Action/Plan:   plan return home   Anticipated DC Date:  11/13/2012   Anticipated DC Plan:  HOME/SELF CARE      DC Planning Services  CM consult      Choice offered to / List presented to:             Status of service:  Completed, signed off Medicare Important Message given?   (If response is "NO", the following Medicare IM given date fields will be blank) Date Medicare IM given:   Date Additional Medicare IM given:    Discharge Disposition:  HOME/SELF CARE  Per UR Regulation:  Reviewed for med. necessity/level of care/duration of stay  If discussed at Long Length of Stay Meetings, dates discussed:    Comments:

## 2013-11-24 HISTORY — PX: PACEMAKER PLACEMENT: SHX43

## 2013-11-24 IMAGING — CR DG CHEST 2V
2 series · 2 of 2 positions shown · non-contrast
Comparison: None

CLINICAL DATA: Preop lumbar fusion

CHEST - 2 VIEW

[view not recorded (1 of 2)]
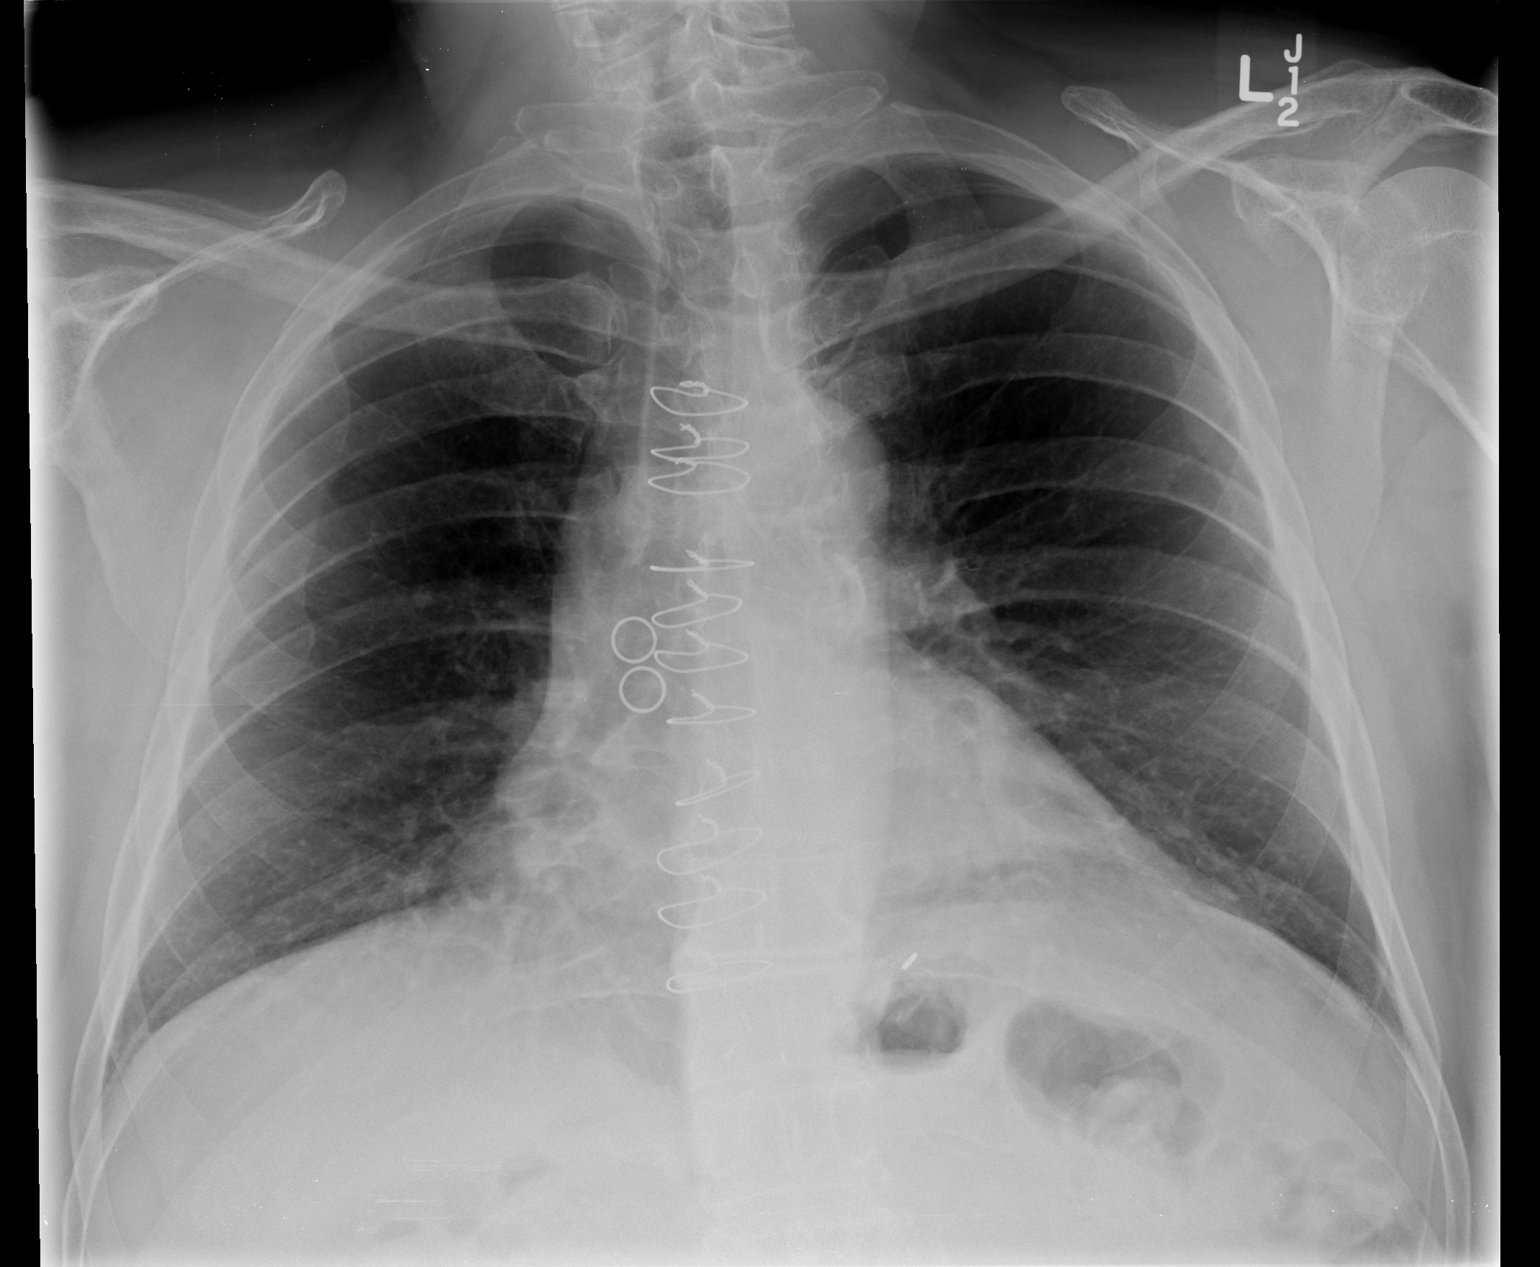

[view not recorded (2 of 2)]
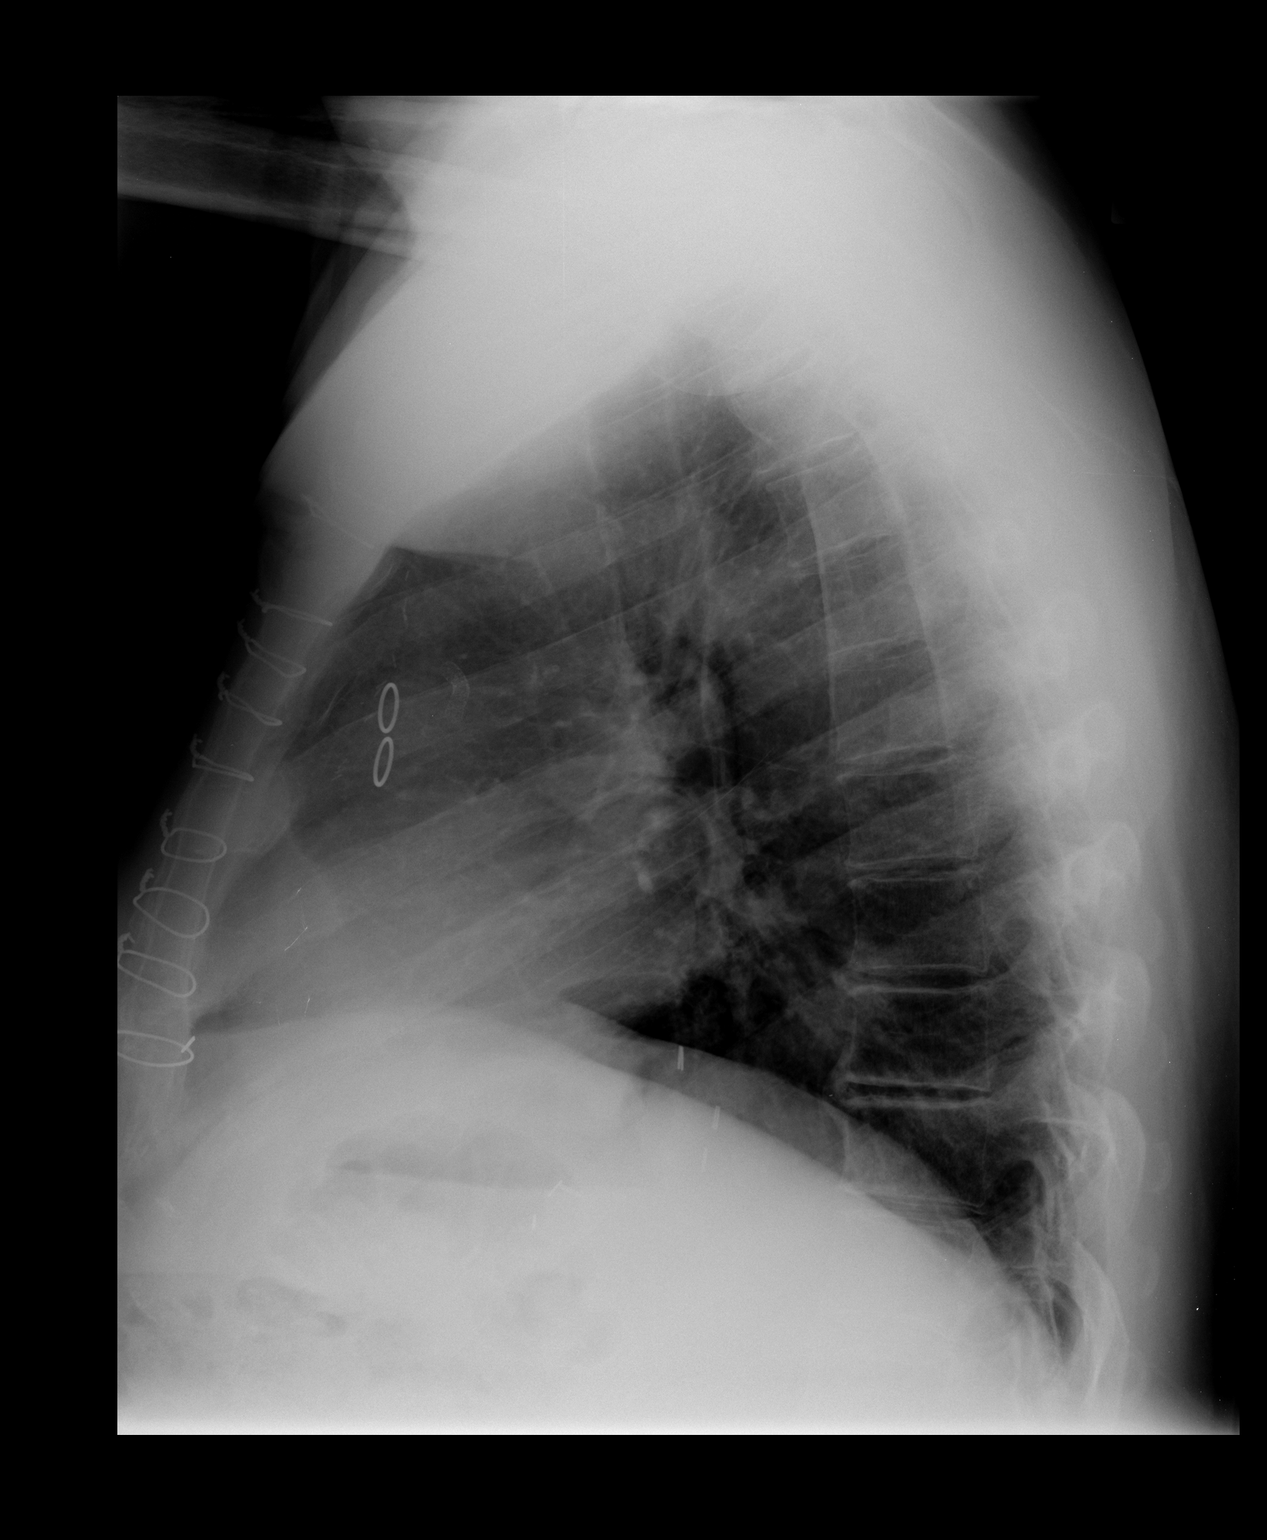

[2 of 2 positions shown; findings below may reference images not displayed]

FINDINGS: Previous median sternotomy and coronary artery bypass
grafting.

Heart size is normal.  No pleural effusion or edema.  Lung volumes
are low.

No airspace consolidation identified.
IMPRESSION: 1.  No acute cardiopulmonary abnormalities.

## 2013-12-01 IMAGING — RF DG LUMBAR SPINE 2-3V
1 series · 2 of 2 positions shown · non-contrast
Comparison: None.

CLINICAL DATA: Low back pain.

DG C-ARM 61-120 MIN,LUMBAR SPINE - 2-3 VIEW

[Series 1: run · 2 of 2 slices shown]
[im 1/2]
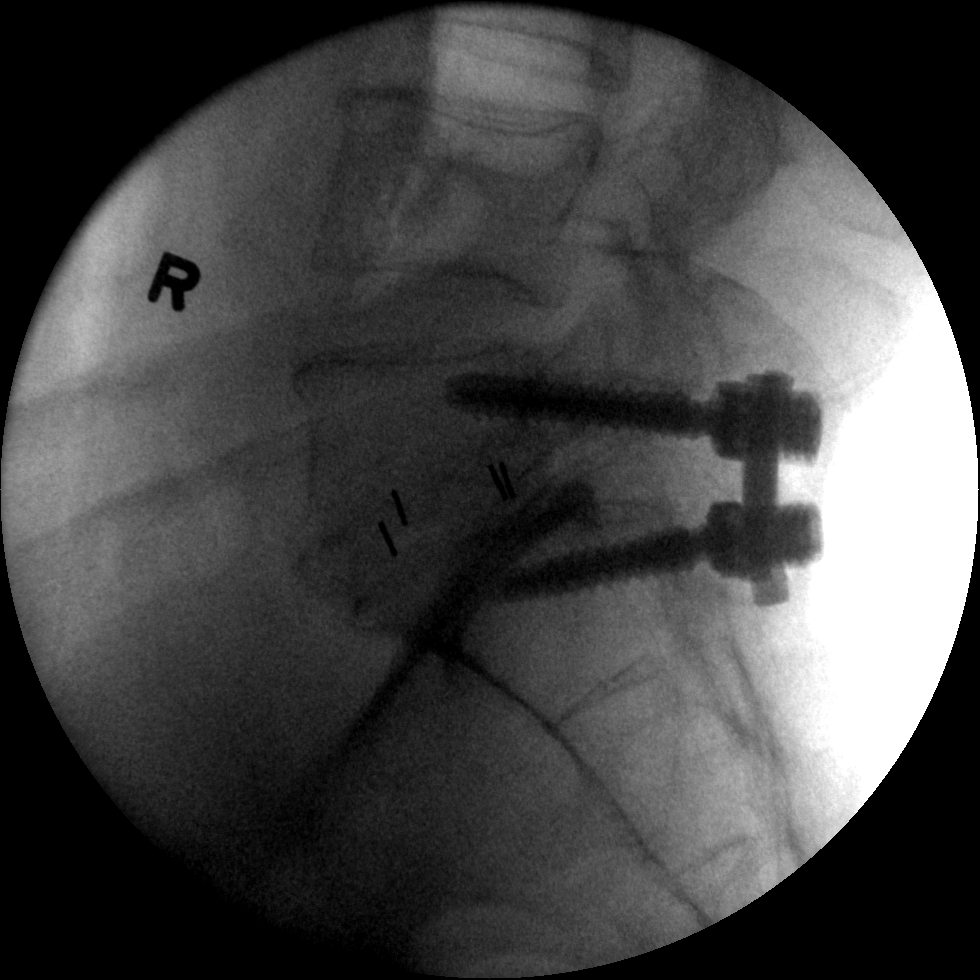
[im 2/2]
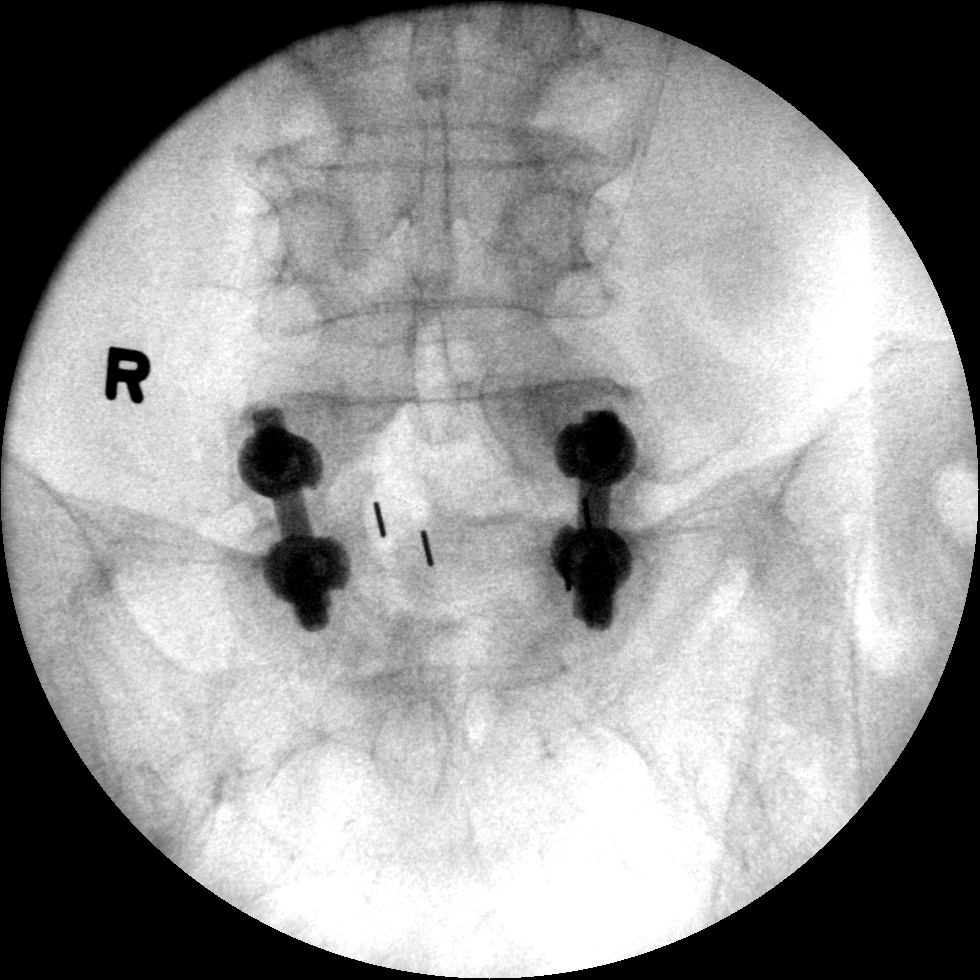

[2 of 2 positions shown; findings below may reference images not displayed]

FINDINGS: C-arm films document pedicle screw and interbody cage
placement in preparation for L5-S1 fusion.  No adverse features.
IMPRESSION: As above.

## 2016-12-21 ENCOUNTER — Observation Stay (HOSPITAL_COMMUNITY): Payer: Medicare Other

## 2016-12-21 ENCOUNTER — Emergency Department (HOSPITAL_COMMUNITY): Payer: Medicare Other

## 2016-12-21 ENCOUNTER — Encounter (HOSPITAL_COMMUNITY): Payer: Self-pay | Admitting: Emergency Medicine

## 2016-12-21 ENCOUNTER — Inpatient Hospital Stay (HOSPITAL_COMMUNITY)
Admission: EM | Admit: 2016-12-21 | Discharge: 2016-12-22 | DRG: 313 | Disposition: A | Discharge status: Home / Self Care | Payer: Medicare Other | Attending: Family Medicine | Admitting: Family Medicine

## 2016-12-21 DIAGNOSIS — E782 Mixed hyperlipidemia: Secondary | ICD-10-CM

## 2016-12-21 DIAGNOSIS — M542 Cervicalgia: Secondary | ICD-10-CM

## 2016-12-21 DIAGNOSIS — E785 Hyperlipidemia, unspecified: Secondary | ICD-10-CM | POA: Diagnosis present

## 2016-12-21 DIAGNOSIS — I4819 Other persistent atrial fibrillation: Secondary | ICD-10-CM

## 2016-12-21 DIAGNOSIS — H8112 Benign paroxysmal vertigo, left ear: Secondary | ICD-10-CM

## 2016-12-21 DIAGNOSIS — F429 Obsessive-compulsive disorder, unspecified: Secondary | ICD-10-CM | POA: Diagnosis present

## 2016-12-21 DIAGNOSIS — Z7901 Long term (current) use of anticoagulants: Secondary | ICD-10-CM

## 2016-12-21 DIAGNOSIS — R0789 Other chest pain: Secondary | ICD-10-CM | POA: Diagnosis present

## 2016-12-21 DIAGNOSIS — I480 Paroxysmal atrial fibrillation: Secondary | ICD-10-CM | POA: Diagnosis present

## 2016-12-21 DIAGNOSIS — E669 Obesity, unspecified: Secondary | ICD-10-CM | POA: Diagnosis present

## 2016-12-21 DIAGNOSIS — F419 Anxiety disorder, unspecified: Secondary | ICD-10-CM | POA: Diagnosis present

## 2016-12-21 DIAGNOSIS — H8110 Benign paroxysmal vertigo, unspecified ear: Secondary | ICD-10-CM

## 2016-12-21 DIAGNOSIS — K219 Gastro-esophageal reflux disease without esophagitis: Secondary | ICD-10-CM | POA: Diagnosis present

## 2016-12-21 DIAGNOSIS — Z8673 Personal history of transient ischemic attack (TIA), and cerebral infarction without residual deficits: Secondary | ICD-10-CM | POA: Diagnosis not present

## 2016-12-21 DIAGNOSIS — I10 Essential (primary) hypertension: Secondary | ICD-10-CM

## 2016-12-21 DIAGNOSIS — R55 Syncope and collapse: Secondary | ICD-10-CM | POA: Diagnosis present

## 2016-12-21 DIAGNOSIS — E872 Acidosis: Secondary | ICD-10-CM | POA: Diagnosis present

## 2016-12-21 DIAGNOSIS — I2510 Atherosclerotic heart disease of native coronary artery without angina pectoris: Secondary | ICD-10-CM | POA: Diagnosis present

## 2016-12-21 DIAGNOSIS — R079 Chest pain, unspecified: Secondary | ICD-10-CM | POA: Diagnosis present

## 2016-12-21 DIAGNOSIS — G4733 Obstructive sleep apnea (adult) (pediatric): Secondary | ICD-10-CM | POA: Diagnosis present

## 2016-12-21 DIAGNOSIS — E1140 Type 2 diabetes mellitus with diabetic neuropathy, unspecified: Secondary | ICD-10-CM | POA: Diagnosis present

## 2016-12-21 DIAGNOSIS — Z888 Allergy status to other drugs, medicaments and biological substances status: Secondary | ICD-10-CM

## 2016-12-21 DIAGNOSIS — Z6830 Body mass index (BMI) 30.0-30.9, adult: Secondary | ICD-10-CM | POA: Diagnosis not present

## 2016-12-21 DIAGNOSIS — Z951 Presence of aortocoronary bypass graft: Secondary | ICD-10-CM

## 2016-12-21 DIAGNOSIS — Z950 Presence of cardiac pacemaker: Secondary | ICD-10-CM | POA: Diagnosis not present

## 2016-12-21 DIAGNOSIS — I25700 Atherosclerosis of coronary artery bypass graft(s), unspecified, with unstable angina pectoris: Secondary | ICD-10-CM

## 2016-12-21 HISTORY — DX: Essential (primary) hypertension: I10

## 2016-12-21 HISTORY — DX: Paroxysmal atrial fibrillation: I48.0

## 2016-12-21 HISTORY — DX: Presence of cardiac pacemaker: Z95.0

## 2016-12-21 HISTORY — DX: Type 2 diabetes mellitus with diabetic neuropathy, unspecified: E11.40

## 2016-12-21 HISTORY — DX: Sleep apnea, unspecified: G47.30

## 2016-12-21 HISTORY — DX: Transient cerebral ischemic attack, unspecified: G45.9

## 2016-12-21 LAB — BASIC METABOLIC PANEL
Anion gap: 8 (ref 5–15)
BUN: 8 mg/dL (ref 6–20)
CHLORIDE: 108 mmol/L (ref 101–111)
CO2: 28 mmol/L (ref 22–32)
CREATININE: 0.9 mg/dL (ref 0.61–1.24)
Calcium: 9.1 mg/dL (ref 8.9–10.3)
GFR calc non Af Amer: >60 mL/min (ref >60)
Glucose, Bld: 97 mg/dL (ref 65–99)
Potassium: 4.3 mmol/L (ref 3.5–5.1)
Sodium: 144 mmol/L (ref 135–145)

## 2016-12-21 LAB — URINALYSIS, ROUTINE W REFLEX MICROSCOPIC
Bilirubin Urine: NEGATIVE
GLUCOSE, UA: NEGATIVE mg/dL
Hgb urine dipstick: NEGATIVE
Ketones, ur: NEGATIVE mg/dL
LEUKOCYTES UA: NEGATIVE
Nitrite: NEGATIVE
PROTEIN: NEGATIVE mg/dL
SPECIFIC GRAVITY, URINE: 1.015 (ref 1.005–1.030)
pH: 6 (ref 5.0–8.0)

## 2016-12-21 LAB — I-STAT TROPONIN, ED: Troponin i, poc: 0.01 ng/mL (ref 0.00–0.08)

## 2016-12-21 LAB — GLUCOSE, CAPILLARY
GLUCOSE-CAPILLARY: 140 mg/dL — AB (ref 65–99)
GLUCOSE-CAPILLARY: 161 mg/dL — AB (ref 65–99)

## 2016-12-21 LAB — CBC
HCT: 46.2 % (ref 39.0–52.0)
Hemoglobin: 15.9 g/dL (ref 13.0–17.0)
MCH: 31.9 pg (ref 26.0–34.0)
MCHC: 34.4 g/dL (ref 30.0–36.0)
MCV: 92.8 fL (ref 78.0–100.0)
PLATELETS: 128 10*3/uL — AB (ref 150–400)
RBC: 4.98 MIL/uL (ref 4.22–5.81)
RDW: 14 % (ref 11.5–15.5)
WBC: 4.7 10*3/uL (ref 4.0–10.5)

## 2016-12-21 LAB — I-STAT CG4 LACTIC ACID, ED: Lactic Acid, Venous: 3.09 mmol/L (ref 0.5–1.9)

## 2016-12-21 LAB — LACTIC ACID, PLASMA
Lactic Acid, Venous: 2.9 mmol/L (ref 0.5–1.9)
Lactic Acid, Venous: 2.8 mmol/L (ref 0.5–1.9)

## 2016-12-21 LAB — TROPONIN I

## 2016-12-21 MED ORDER — DIVALPROEX SODIUM 500 MG PO DR TAB
750.0000 mg | DELAYED_RELEASE_TABLET | Freq: Three times a day (TID) | ORAL | Status: DC
  Administered 2016-12-21 – 2016-12-22 (×3): 750 mg via ORAL
  Filled 2016-12-21 (×3): qty 1

## 2016-12-21 MED ORDER — ACETAMINOPHEN 650 MG RE SUPP: 650.0000 mg | RECTAL | Status: DC | PRN

## 2016-12-21 MED ORDER — CLONAZEPAM 1 MG PO TABS: 1.0000 mg | ORAL_TABLET | Freq: Two times a day (BID) | ORAL | Status: DC

## 2016-12-21 MED ORDER — ACETAMINOPHEN 325 MG PO TABS: 650.0000 mg | ORAL_TABLET | ORAL | Status: DC | PRN

## 2016-12-21 MED ORDER — PREGABALIN 75 MG PO CAPS
75.0000 mg | ORAL_CAPSULE | Freq: Two times a day (BID) | ORAL | Status: DC
  Administered 2016-12-21 – 2016-12-22 (×2): 75 mg via ORAL
  Filled 2016-12-21 (×2): qty 1

## 2016-12-21 MED ORDER — LOSARTAN POTASSIUM 25 MG PO TABS
25.0000 mg | ORAL_TABLET | Freq: Every day | ORAL | Status: DC
  Administered 2016-12-22: 25 mg via ORAL
  Filled 2016-12-21 (×2): qty 1

## 2016-12-21 MED ORDER — CLONAZEPAM 0.5 MG PO TABS: 2.0000 mg | ORAL_TABLET | Freq: Two times a day (BID) | ORAL | Status: DC

## 2016-12-21 MED ORDER — ACETAMINOPHEN 160 MG/5ML PO SOLN: 650.0000 mg | ORAL | Status: DC | PRN

## 2016-12-21 MED ORDER — SODIUM CHLORIDE 0.9 % IV SOLN
INTRAVENOUS | Status: DC
  Administered 2016-12-21: 22:00:00 via INTRAVENOUS

## 2016-12-21 MED ORDER — VITAMIN D 1000 UNITS PO TABS
2000.0000 [IU] | ORAL_TABLET | Freq: Every day | ORAL | Status: DC
  Administered 2016-12-21 – 2016-12-22 (×2): 2000 [IU] via ORAL
  Filled 2016-12-21 (×2): qty 2

## 2016-12-21 MED ORDER — ROSUVASTATIN CALCIUM 10 MG PO TABS
10.0000 mg | ORAL_TABLET | Freq: Every evening | ORAL | Status: DC
  Administered 2016-12-21: 10 mg via ORAL
  Filled 2016-12-21: qty 1

## 2016-12-21 MED ORDER — ESCITALOPRAM OXALATE 10 MG PO TABS
10.0000 mg | ORAL_TABLET | Freq: Every morning | ORAL | Status: DC
  Administered 2016-12-21 – 2016-12-22 (×2): 10 mg via ORAL
  Filled 2016-12-21 (×2): qty 1

## 2016-12-21 MED ORDER — SENNOSIDES-DOCUSATE SODIUM 8.6-50 MG PO TABS: 1.0000 | ORAL_TABLET | Freq: Every evening | ORAL | Status: DC | PRN

## 2016-12-21 MED ORDER — HEPARIN (PORCINE) IN NACL 100-0.45 UNIT/ML-% IJ SOLN
1450.0000 [IU]/h | INTRAMUSCULAR | Status: DC
  Administered 2016-12-21: 1450 [IU]/h via INTRAVENOUS
  Filled 2016-12-21: qty 250

## 2016-12-21 MED ORDER — ARIPIPRAZOLE 5 MG PO TABS
5.0000 mg | ORAL_TABLET | Freq: Every evening | ORAL | Status: DC | PRN
  Filled 2016-12-21: qty 1

## 2016-12-21 MED ORDER — BUPROPION HCL ER (SR) 100 MG PO TB12
100.0000 mg | ORAL_TABLET | Freq: Every day | ORAL | Status: DC
  Administered 2016-12-21 – 2016-12-22 (×2): 100 mg via ORAL
  Filled 2016-12-21 (×2): qty 1

## 2016-12-21 MED ORDER — CLONAZEPAM 1 MG PO TABS
1.0000 mg | ORAL_TABLET | Freq: Two times a day (BID) | ORAL | Status: DC
  Administered 2016-12-21 – 2016-12-22 (×2): 1 mg via ORAL

## 2016-12-21 MED ORDER — ASPIRIN 81 MG PO CHEW
81.0000 mg | CHEWABLE_TABLET | Freq: Once | ORAL | Status: DC
  Filled 2016-12-21: qty 1

## 2016-12-21 MED ORDER — PANTOPRAZOLE SODIUM 40 MG PO TBEC
80.0000 mg | DELAYED_RELEASE_TABLET | Freq: Every day | ORAL | Status: DC
  Administered 2016-12-22: 80 mg via ORAL
  Filled 2016-12-21: qty 2

## 2016-12-21 MED ORDER — STROKE: EARLY STAGES OF RECOVERY BOOK
Freq: Once | Status: DC
  Filled 2016-12-21: qty 1

## 2016-12-21 MED ORDER — CLONAZEPAM 0.5 MG PO TABS
1.0000 mg | ORAL_TABLET | Freq: Two times a day (BID) | ORAL | Status: DC
  Filled 2016-12-21: qty 2

## 2016-12-21 NOTE — ED Provider Notes (Signed)
[facility]-EMERGENCY DEPT Provider Note   CSN: [id number] Arrival date & time:        History   Chief Complaint Chief Complaint  Patient presents with  . Chest Pain    HPI Edward Arellano is a 66 y.o. male.  HPI   Patient is a 66 year old male with a comp located past medical history. Patient not usually seen at this hospital, usually sent to [facility]. patient's history is significant for syncope, brady/tachycardia syndrome, paroxysmal A. fib, CABG 4, neuropathy, somatoform disorder, TIA.  He is presenting today with sharp chest pain. Patient has had intermittent sharp chest pain since this morning. It radiates down his left arm. Not associated with shortness of breath. Not associated with exertion. Patient has pacemaker. He reports he had a sharp pain and then passed out.  Patient reports he is not had any chest pain along. Time.  Past Medical History:  Diagnosis Date  . Anxiety   . Arthritis   . Complication of anesthesia    Takes alot to sedate patient.  . Coronary artery disease   . Diabetes mellitus without complication (HCC)    A1C 5.0  . GERD (gastroesophageal reflux disease)    corrected with Hital Hernia  . H/O hiatal hernia    corrected  . WUJWJXBJ(478.2Headache(784.0)    sees a neuroologist- Dr. Celine Mansebra Kirby  . Mental disorder    OCD.       Patient Active Problem List   Diagnosis Date Noted  . Chest pain 12/21/2016    Past Surgical History:  Procedure Laterality Date  . APPENDECTOMY    . Colonscopy    . CORONARY ARTERY BYPASS GRAFT  2001  . HERNIA REPAIR    . INGUINAL HERNIA REPAIR     Right and Left  . PERCUTANEOUS PINNING TOE FRACTURE     right 5th toe       Home Medications    Prior to Admission medications   Medication Sig Start Date End Date Taking? Authorizing Provider  acetaminophen (TYLENOL) 650 MG CR tablet Take 650 mg by mouth 2 (two) times daily as needed. For pain    Historical Provider, MD  clonazePAM (KLONOPIN) 1 MG tablet Take 2  mg by mouth 2 (two) times daily. Name brand only    Historical Provider, MD  divalproex (DEPAKOTE) 500 MG DR tablet Take 750 mg by mouth 2 (two) times daily.    Historical Provider, MD  escitalopram (LEXAPRO) 10 MG tablet Take 10 mg by mouth every morning.    Historical Provider, MD  HYDROcodone-acetaminophen (NORCO/VICODIN) 5-325 MG per tablet Take 1-2 tablets by mouth every 4 (four) hours as needed. 11/12/12   Aliene Beamsandy Kritzer, MD  Multiple Vitamin (MULTIVITAMIN WITH MINERALS) TABS Take 1 tablet by mouth every morning.    Historical Provider, MD  niacin 500 MG CR capsule Take 1,500 mg by mouth at bedtime.    Historical Provider, MD  rosuvastatin (CRESTOR) 10 MG tablet Take 10 mg by mouth every evening.    Historical Provider, MD    Family History History reviewed. No pertinent family history.  Social History Social History  Substance Use Topics  . Smoking status: Never Smoker  . Smokeless tobacco: Never Used  . Alcohol use No     Allergies   Nitroglycerin; Cyclobenzaprine; Niacin; and Tizanidine   Review of Systems Review of Systems  Constitutional: Negative for fatigue and fever.  Respiratory: Negative for cough and shortness of breath.   Cardiovascular: Positive for chest pain.  Gastrointestinal: Negative for abdominal pain.  Genitourinary: Negative for flank pain.  Musculoskeletal: Negative for back pain.  Neurological: Positive for dizziness and syncope.  All other systems reviewed and are negative.    Physical Exam Updated Vital Signs BP 125/82   Pulse 71   Temp 98.1 F (36.7 C) (Oral)   Resp 17   SpO2 92%   Physical Exam  Constitutional: He is oriented to person, place, and time. He appears well-nourished.  HENT:  Head: Normocephalic.  Eyes: Conjunctivae and EOM are normal. Pupils are equal, round, and reactive to light.  Cardiovascular: Normal rate.   Pulmonary/Chest: Effort normal and breath sounds normal.  Scar from pacemaker placement.  Abdominal: Soft.  Bowel sounds are normal. There is no tenderness.  Neurological: He is oriented to person, place, and time. No cranial nerve deficit. Coordination normal.  Patient has slow deliberate speech, which appears baseline.  Equal strength bilaterally upper and lower extremities negative pronator drift. Normal sensation bilaterally. Speech comprehensible, no slurring. Facial nerve tested and appears grossly normal. Alert and oriented 3.   Skin: Skin is warm and dry. He is not diaphoretic.  Psychiatric: He has a normal mood and affect. His behavior is normal.     ED Treatments / Results  Labs (all labs ordered are listed, but only abnormal results are displayed) Labs Reviewed  CBC - Abnormal; Notable for the following:       Result Value   Platelets 128 (*)    All other components within normal limits  I-STAT CG4 LACTIC ACID, ED - Abnormal; Notable for the following:    Lactic Acid, Venous 3.09 (*)    All other components within normal limits  BASIC METABOLIC PANEL  URINALYSIS, ROUTINE W REFLEX MICROSCOPIC  I-STAT TROPOININ, ED  I-STAT TROPOININ, ED    EKG  EKG Interpretation  Date/Time:  Friday December 21 2016 10:01:45 EST Ventricular Rate:  64 PR Interval:    QRS Duration: 108 QT Interval:  424 QTC Calculation: 438 R Axis:   -26 Text Interpretation:  Atrial-paced complexes Borderline left axis deviation Baseline wander in lead(s) V2 no STEMI noted Confirmed by Kandis Mannan ([id number]) on 12/21/2016 10:11:52 AM       Radiology Dg Chest 2 View  Result Date: 12/21/2016 CLINICAL DATA:  Left chest pain radiating into the left arm today. EXAM: CHEST  2 VIEW COMPARISON:  PA and lateral chest 12/04/2016 and 01/05/2016. FINDINGS: The patient is status post CABG with a pacing device in place. Heart size is upper normal. Lungs are clear. No pneumothorax or pleural fluid. No acute bony abnormality. IMPRESSION: No acute disease. Electronically Signed   By: Drusilla Kanner M.D.   On:  12/21/2016 10:52    Procedures Procedures (including critical care time)  Medications Ordered in ED Medications - No data to display   Initial Impression / Assessment and Plan / ED Course  I have reviewed the triage vital signs and the nursing notes.  Pertinent labs & imaging results that were available during my care of the patient were reviewed by me and considered in my medical decision making (see chart for details).     Patient's history is significant for syncope, brady/tachycardia syndrome, paroxysmal A. fib, CABG 4, neuropathy, somatoform disorder, TIA.  Patient here with chest pain sharp, not pleuritic. Radiating down left arm. Given patient's past medical history or CABG, elevated heart score, will require admission for chest pain rule out. Doubt PE, doubt aortic dissection.   Symptoms are mildly concerning  for ischemic heart disease.  Patient also had syncope, patient has pacemaker we will try to interrogate pacer.    Final Clinical Impressions(s) / ED Diagnoses   Final diagnoses:  Chest pain, unspecified type    New Prescriptions New Prescriptions   No medications on file     Guiliana Shor Randall An, MD 12/21/16 1421

## 2016-12-21 NOTE — Progress Notes (Signed)
FMTS Brief Attending Admit Note  I saw patient today at 4:15pm. Will cosign resident note when H&P becomes available.  PMHx as obtained by Care Everywhere (gets all his care through [facility]): CAD s/p CABG, afib, ED, HTN, OSA on CPAP, prior stroke, T2DM, HLD, GERD, GAD/somatiform disorder, BPPV, chronic migraine, prior TIA, peripheral neuropathy  Acute Issues: 1. Chest pain and syncope - though chest pain atypical in description, patient with significant cardiac history including prior CABG, pacer in place, tachy/brady syndrome, PAF. Initial EKG and troponin negative (atrial paced on EKG) - consult cardiology - likely needs pacemaker interrogation - trend troponins & repeat EKG in AM - orthostatics, IVF  2. Concern for stroke symptoms - report of slurred speech earlier today, now resolved. Some slight facial droop noted on exam (wife has noted this as well). S/p fall earlier, and on eliquis. Not able to have MRI due to pacemaker - stat CT head and cervical spine to rule out bleed/injury as complains of some headache/neck pain - neuro consult for recommendations in lieu of MRI due to pacemaker - hold anticoagulation until CT resulted  Await completion of resident H&P - will cosign later this evening.  Levert FeinsteinBrittany Garnell Begeman, MD [facility]

## 2016-12-21 NOTE — Progress Notes (Signed)
[employer] rep will interrogate pacer in AM -

## 2016-12-21 NOTE — Progress Notes (Signed)
ANTICOAGULATION CONSULT NOTE - Initial Consult  Pharmacy Consult for Heparin Indication: atrial fibrillation / chest pain (Eliquis on hold)  Allergies  Allergen Reactions  . Nitroglycerin Palpitations  . Cyclobenzaprine Other (See Comments)    Dry mouth  . Niacin Other (See Comments)    flushing  . Tizanidine Other (See Comments)    Dry mouth    Patient Measurements: Height: 6\' 2"  (188 cm) Weight: 235 lb 8 oz (106.8 kg) IBW/kg (Calculated) : 82.2  Vital Signs: Temp: 98.1 F (36.7 C) (01/19 1800) Temp Source: Oral (01/19 1543) BP: 155/84 (01/19 1543) Pulse Rate: 69 (01/19 1543)  Labs:  Recent Labs  12/21/16 1015 12/21/16 1829  HGB 15.9  --   HCT 46.2  --   PLT 128*  --   CREATININE 0.90  --   TROPONINI  --  <0.03    Estimated Creatinine Clearance: 102.2 mL/min (by C-G formula based on SCr of 0.9 mg/dL).   Medical History: Past Medical History:  Diagnosis Date  . Anxiety   . Arthritis   . Complication of anesthesia    Takes alot to sedate patient.  . Coronary artery disease   . Diabetes mellitus with neuropathy (HCC)    A1C 5.0  . GERD (gastroesophageal reflux disease)    corrected with Hital Hernia  . H/O hiatal hernia    corrected  . ZOXWRUEA(540.9Headache(784.0)    sees a neuroologist- Dr. Celine Mansebra Kirby  . Hypertension   . Mental disorder    OCD.     [name]. Paroxysmal a-fib (HCC)    Pacemaker in place  . Presence of permanent cardiac pacemaker   . Sleep apnea    USES CPAP  . TIA (transient ischemic attack)    Assessment: 66 year old male with a history of PAF on Eliquis PTA admitted with chest pain and stroke like symptoms.  Head CT negative for bleed and have been given approval to begin heparin by neurology and family medicine while Eliquis is on hold in preparation for cath on Monday.  Last dose of Eilquis 1/18 at 20:30 pm Will use PTT and heparin levels to guide therapy until labs are coorelating  Goal of Therapy:  Heparin level 0.3-0.5 units/ml Monitor  platelets by anticoagulation protocol: Yes  PTT = 66 to 102 seconds   Plan:  No heparin bolus given initial stroke like symptoms Heparin drip at 1450 units / hr Daily heparin level, CBC, PTT  Thank  You Okey RegalLisa Canisha Issac, PharmD [phone]  Elwin SleightPowell, Anjolie Majer Kay 12/21/2016,8:17 PM

## 2016-12-21 NOTE — H&P (Signed)
[facility] Medicine [facility] Admission History and Physical Service Pager: [phone]  Patient name: Edward Arellano Medical record number: [id number] Date of birth: 01/23/1951 Age: 66 y.o. Gender: male  Primary Care Provider: No primary care provider on file. [facility]  Consultants: Cardiology and Neurology Code Status: FULL  Chief Complaint: chest pain and syncope  Assessment and Plan: Edward Arellano is a 66 y.o. male presenting with chest pain and syncope. 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  #Syncope with acute neurologic change. Patient had LOC around 8:30am this morning with unwitnessed fall, woke on his back, may have hit head. On admission was initially well but during finger-to-nose started intermittently having changes in mental status with staring off and periodically not responding with BP elevation to 177/118 from 120s/80s. Given patient is on eliquis at home and had unwitnessed fall concern for subdural hematoma. Also concern for acute CVA/TIA given patient also had slurred speech and weakness this morning, cannot get MRI due to pacemaker. Has h/o TIA in the past. Differential also includes syncope 2/2 sick sinus syndrome as per his long standing history but unlikely given has never had chest pain in conjunction with previous episodes and is now having change in his neurological status.  - Place in observation, attending Dr. Pollie MeyerMcIntyre - CT head/neck pending, ordered STAT from ED - holding home eliquis - Monitor on telemetry - NPO until passes bedside swallow - Neuro checks q2 for 12 hours then q4 - PT/OT/SLP consult - carotid ultrasound  - risk stratification labs: a1c, lipid panel - neurology consult, appreciate recommendations -  Orthostatic VS  #Chest pain in the setting of CAD s/p CABG x4 and sick sinus syndrome s/p pacemaker Sharp stabbing substernal pain that occurred at rest this morning. Last lexiscan myoview on 06/08/16 shows EF 55%, no clinical ischemia. HEART score 6. POC trop 0.01 and EKG shows no ST changes.  - trend troponins - monitor on telemetry - am EKG - continue home toprol 50mg  daily - will need interrogation of pacemaker  - cardiology consult, appreciate recommendations  #HTN only on metoprolol at home currently - Permissive HTN given possible CVA pending work up  [employer]#Paroxysmal atrial fibrillation, on eliquis and metoprolol at home - holding home medications given neuro work up - monitor on telemetry  #HLD at home on crestor 10mg  daily. Last lipid panel 03/31/16 LDL 70 - continue home statin - lipid panel pending  #h/o diabetes per care everywhere past several a1c have been 5.7 -5.5. Has not been on any home medications recently.  #GERD at home on esomeprazole - PPI  #Anxiety at home on lexapro, wellbutrin, abilify - continue home medications  #Idiopathic neuropathy at home on lyrica - continue home medication   FEN/GI: NPO until passes bedside swallow, then heart healthy diet, protonix Prophylaxis: none given neuro work up  Disposition: pending  History of Present Illness:  Edward Arellano is a 66 y.o. male presenting with chest pain and LOC with fall.   Started with severe sharp stabbing substernal chest pain this morning that woke him up from sleep. He has never had chest pain like this before. The chest pain is also described as intermittent, lasting 35 seconds to 2 minutes per episode, self resolved. During the chest pain episodes he denied any diaphoresis, feeling cold/clammy, palpitations  or shortness of breath.  He also had a syncopal episode this morning where he woke up on his back in his kitchen while he was having the chest pain. After the syncopal episode he had slurred  speech and blurred vision. His wife found him in the kitchen at approximately 0830 and called the EMS. This is the first syncopal episode he has had since his pacemaker was placed approximately 4 years ago, but did have syncopal episodes prior to its placement. He is unsure of if he hit his head, but he states that he does have a headache presently. He had dizziness described as the room spinning, but he typically has this symptom as he has vertigo. When he woke up he was confused but he did not urinate or soil himself. Reports recent feeling of hard to get food down while swallowing over the past few months.   Review Of Systems: Per HPI with the following additions:  Review of Systems  Constitutional: Negative for diaphoresis.  Eyes: Positive for blurred vision.  Respiratory: Negative for cough.   Cardiovascular: Positive for chest pain. Negative for palpitations.  Gastrointestinal: Negative for abdominal pain, constipation and diarrhea.       + Trouble swallowing  Genitourinary: Negative for dysuria.  Neurological: Positive for dizziness, speech change (Slurred speech), loss of consciousness and headaches. Negative for focal weakness (No new weakness, but lower extremity weakness at baseline).    Patient Active Problem List   Diagnosis Date Noted  . Chest pain 12/21/2016    Past Medical History: Past Medical History:  Diagnosis Date  . Anxiety   . Arthritis   . Complication of anesthesia    Takes alot to sedate patient.  . Coronary artery disease   . Diabetes mellitus with neuropathy (HCC)    A1C 5.0  . GERD (gastroesophageal reflux disease)    corrected with Hital Hernia  . H/O hiatal hernia    corrected  . AVWUJWJX(914.7)    sees a neuroologist- Dr. Celine Mans  . Hypertension   . Mental disorder    OCD.     [name] Paroxysmal a-fib (HCC)    Pacemaker in place  . TIA (transient ischemic attack)     Past Surgical History: Past Surgical History:  Procedure Laterality Date   . APPENDECTOMY    . Colonscopy    . CORONARY ARTERY BYPASS GRAFT  2001  . HERNIA REPAIR    . INGUINAL HERNIA REPAIR     Right and Left  . PERCUTANEOUS PINNING TOE FRACTURE     right 5th toe   Social History: Social History  Substance Use Topics  . Smoking status: Never Smoker  . Smokeless tobacco: Never Used  . Alcohol use No   Additional social history: Lives at home with wife. Denies EtOH or recreational drug use. Please also refer to relevant sections of EMR.  Family History: Family History  Problem Relation Age of Onset  . Cancer Mother   . Prostate cancer Father   . Cancer Brother    Allergies and Medications: Allergies  Allergen Reactions  . Nitroglycerin Palpitations  . Cyclobenzaprine Other (See Comments)    Dry mouth  . Niacin Other (See Comments)    flushing  . Tizanidine Other (See Comments)    Dry mouth   No current facility-administered medications on file prior to encounter.    Current Outpatient Prescriptions on File Prior to Encounter  Medication Sig Dispense Refill  . clonazePAM (KLONOPIN) 1 MG tablet Take 2 mg  by mouth 2 (two) times daily. Name brand only    . divalproex (DEPAKOTE) 500 MG DR tablet Take 750 mg by mouth 3 (three) times daily.     [name] escitalopram (LEXAPRO) 10 MG tablet Take 10 mg by mouth every morning.    . rosuvastatin (CRESTOR) 10 MG tablet Take 10 mg by mouth every evening.    [name] acetaminophen (TYLENOL) 650 MG CR tablet Take 650 mg by mouth 2 (two) times daily as needed. For pain    . HYDROcodone-acetaminophen (NORCO/VICODIN) 5-325 MG per tablet Take 1-2 tablets by mouth every 4 (four) hours as needed. (Patient not taking: Reported on 12/21/2016) 90 tablet 1  . Multiple Vitamin (MULTIVITAMIN WITH MINERALS) TABS Take 1 tablet by mouth every morning.    . niacin 500 MG CR capsule Take 1,500 mg by mouth at bedtime.      Objective: BP 105/79   Pulse 77   Temp 98.1 F (36.7 C) (Oral)   Resp 14   SpO2 95%  Exam: General: Sitting  up in bed comfortably, in no distress Head: back of head and L side of neck TTP without bruising Eyes: PERRL, EOMI, conjunctiva normal ENTM: MMM Neck: supple, normal ROM Cardiovascular: RRR, normal S1 and S2, no murmurs Respiratory: CTAB, normal effort on room air Gastrointestinal: soft, nontender, nondistended, + bowel sounds MSK: strength 5/5, moving limbs spontaneously Derm: warm and dry, no ecchymosis Neuro: alert and oriented until finger-to-nose testing, patient then starting off periodically for few sec but able to focus briefly with verbal stimuli however still appears confused. Slight L facial droop. Strength 5/5 and sensation intact throughout. No tongue or uvula deviation. No dysarthria. Gait unsteady but may be at his baseline. Finger to nose intact but slowed, rapid alt movement equally slowed. Psych: appropriate affect  Labs and Imaging: CBC BMET   Recent Labs Lab 12/21/16 1015  WBC 4.7  HGB 15.9  HCT 46.2  PLT 128*    Recent Labs Lab 12/21/16 1015  NA 144  K 4.3  CL 108  CO2 28  BUN 8  CREATININE 0.90  GLUCOSE 97  CALCIUM 9.1    Troponin POC 0.01 Lactic acid 3.09  Dg Chest 2 View  Result Date: 12/21/2016 CLINICAL DATA:  Left chest pain radiating into the left arm today. EXAM: CHEST  2 VIEW COMPARISON:  PA and lateral chest 12/04/2016 and 01/05/2016. FINDINGS: The patient is status post CABG with a pacing device in place. Heart size is upper normal. Lungs are clear. No pneumothorax or pleural fluid. No acute bony abnormality. IMPRESSION: No acute disease. Electronically Signed   By: Drusilla Kanner M.D.   On: 12/21/2016 10:52    Leland Her, DO 12/21/2016, 3:10 PM PGY-1, [facility] FPTS Intern pager: [phone], text pages welcome   Upper Level Addendum:  I have seen and evaluated this patient along with Dr. Artist Pais and reviewed the above note, making necessary revisions in red.   Kathee Delton, MD,MS,  PGY3 12/21/2016 5:47 PM

## 2016-12-21 NOTE — Consult Note (Signed)
Reason for Consult: syncope    Referring Physician: Dr. Ardelia Mems   PCP:  No primary care provider on file.  Primary Cardiologist: at [facility] Dr. Damita Lack Edward Arellano is an 66 y.o. male.    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  He has CAD with CABG X 4 and nuc study was done 06/08/16 with normal LV function EF 55% and no ischemia.  EKG in 04/2016 with SR minimal voltage criteria for LVH.  Non specific T wave abnormality.  He had a tilt test done in 06/2011 that was + most consistent with neurocardiogenic syncope.   He also has OSA on CPAP.  Hx of global amnesia in past with PPM the amnesia and syncope resolved.   Now admitted after transfer from [facility] for chest pain and slurred speech.  He also had syncope this AM with fall and changes in mental status.  His CABG was > 20 years ago.  He developed substernal sharp chest pain this AM and went to get tylenol, stood up and very dizzy and passed out.  He has another episode of chest pain here.  No SOB , nausea or dia pheresis  with the pain.    EKG with SR no acute changes.  Troponin poc 0.01  Lactic acid 3.09 , BMP and CBC wnl though plts 128.  CXR clear CT head and spine without abnormality  Currently just finished all of supper.  He has no complaints      Past Medical History:  Diagnosis Date  . Anxiety   . Arthritis   . Complication of anesthesia    Takes alot to sedate patient.  . Coronary artery disease   . Diabetes mellitus with neuropathy (HCC)    A1C 5.0  . GERD (gastroesophageal reflux disease)    corrected with Hital Hernia  . H/O hiatal hernia    corrected  . DHWYSHUO(372.9)    sees a neuroologist- Dr. Geraldine Contras  .  Hypertension   . Mental disorder    OCD.     [name] Paroxysmal a-fib ([place])    Pacemaker in place  . TIA (transient ischemic attack)     Past Surgical History:  Procedure Laterality Date  . APPENDECTOMY    . Colonscopy    . CORONARY ARTERY BYPASS GRAFT  2001  . HERNIA REPAIR    . INGUINAL HERNIA REPAIR     Right and Left  . PERCUTANEOUS PINNING TOE FRACTURE     right 5th toe    Family History  Problem Relation Age of Onset  . Cancer Mother   . Prostate cancer Father   . Cancer Brother    Social History:  reports that he has never smoked. He has never used smokeless tobacco. He reports that he does not drink alcohol or use drugs.  Allergies:  Allergies  Allergen Reactions  . Nitroglycerin Palpitations  . Cyclobenzaprine Other (See Comments)    Dry mouth  . Niacin Other (See Comments)    flushing  . Tizanidine Other (See Comments)    Dry mouth    OUTPATIENT MEDICATIONS: No current facility-administered medications on file prior to encounter.    Current Outpatient Prescriptions on File Prior to Encounter  Medication Sig  Dispense Refill  . clonazePAM (KLONOPIN) 1 MG tablet Take 2 mg by mouth 2 (two) times daily. Name brand only    . divalproex (DEPAKOTE) 500 MG DR tablet Take 750 mg by mouth 3 (three) times daily.     [name] escitalopram (LEXAPRO) 10 MG tablet Take 10 mg by mouth every morning.    . rosuvastatin (CRESTOR) 10 MG tablet Take 10 mg by mouth every evening.    [name] acetaminophen (TYLENOL) 650 MG CR tablet Take 650 mg by mouth 2 (two) times daily as needed. For pain    . HYDROcodone-acetaminophen (NORCO/VICODIN) 5-325 MG per tablet Take 1-2 tablets by mouth every 4 (four) hours as needed. (Patient not taking: Reported on 12/21/2016) 90 tablet 1  . Multiple Vitamin (MULTIVITAMIN WITH MINERALS) TABS Take 1 tablet by mouth every morning.    . niacin 500 MG CR capsule Take 1,500 mg by mouth at bedtime.     CURRENT MEDICATIONS:  Results for orders placed or performed during  the hospital encounter of 12/21/16 (from the past 48 hour(s))  Basic metabolic panel     Status: None   Collection Time: 12/21/16 10:15 AM  Result Value Ref Range   Sodium 144 135 - 145 mmol/L   Potassium 4.3 3.5 - 5.1 mmol/L   Chloride 108 101 - 111 mmol/L   CO2 28 22 - 32 mmol/L   Glucose, Bld 97 65 - 99 mg/dL   BUN 8 6 - 20 mg/dL   Creatinine, Ser 0.90 0.61 - 1.24 mg/dL   Calcium 9.1 8.9 - 10.3 mg/dL   GFR calc non Af Amer >60 >60 mL/min   GFR calc Af Amer >60 >60 mL/min    Comment: (NOTE) The eGFR has been calculated using the CKD EPI equation. This calculation has not been validated in all clinical situations. eGFR's persistently <60 mL/min signify possible Chronic Kidney Disease.    Anion gap 8 5 - 15  CBC     Status: Abnormal   Collection Time: 12/21/16 10:15 AM  Result Value Ref Range   WBC 4.7 4.0 - 10.5 K/uL   RBC 4.98 4.22 - 5.81 MIL/uL   Hemoglobin 15.9 13.0 - 17.0 g/dL   HCT 46.2 39.0 - 52.0 %   MCV 92.8 78.0 - 100.0 fL   MCH 31.9 26.0 - 34.0 pg   MCHC 34.4 30.0 - 36.0 g/dL   RDW 14.0 11.5 - 15.5 %   Platelets 128 (L) 150 - 400 K/uL  I-stat troponin, ED     Status: None   Collection Time: 12/21/16 10:42 AM  Result Value Ref Range   Troponin i, poc 0.01 0.00 - 0.08 ng/mL   Comment 3            Comment: Due to the release kinetics of cTnI, a negative result within the first hours of the onset of symptoms does not rule out myocardial infarction with certainty. If myocardial infarction is still suspected, repeat the test at appropriate intervals.   I-Stat CG4 Lactic Acid, ED     Status: Abnormal   Collection Time: 12/21/16 10:44 AM  Result Value Ref Range   Lactic Acid, Venous 3.09 (HH) 0.5 - 1.9 mmol/L   Comment NOTIFIED PHYSICIAN   Urinalysis, Routine w reflex microscopic     Status: None   Collection Time: 12/21/16 11:19 AM  Result Value Ref Range   Color, Urine YELLOW YELLOW   APPearance CLEAR CLEAR   Specific Gravity, Urine 1.015 1.005 - 1.030  pH 6.0 5.0 - 8.0   Glucose, UA NEGATIVE NEGATIVE mg/dL   Hgb urine dipstick NEGATIVE NEGATIVE   Bilirubin Urine NEGATIVE NEGATIVE   Ketones, ur NEGATIVE NEGATIVE mg/dL   Protein, ur NEGATIVE NEGATIVE mg/dL   Nitrite NEGATIVE NEGATIVE   Leukocytes, UA NEGATIVE NEGATIVE   Dg Chest 2 View  Result Date: 12/21/2016 CLINICAL DATA:  Left chest pain radiating into the left arm today. EXAM: CHEST  2 VIEW COMPARISON:  PA and lateral chest 12/04/2016 and 01/05/2016. FINDINGS: The patient is status post CABG with a pacing device in place. Heart size is upper normal. Lungs are clear. No pneumothorax or pleural fluid. No acute bony abnormality. IMPRESSION: No acute disease. Electronically Signed   By: Inge Rise M.D.   On: 12/21/2016 10:52    ROS: General:+ colds around Christmas, no fevers, no weight changes Skin:no rashes or ulcers HEENT:no blurred vision, no congestion CV:see HPI PUL:see HPI GI:no diarrhea constipation or melena, no indigestion GU:no hematuria, no dysuria MS:no joint pain, no claudication Neuro:+ syncope, no lightheadedness Endo:+ diabetes, no thyroid disease   Blood pressure (!) 155/84, pulse 69, temperature 98 F (36.7 C), temperature source Oral, resp. rate 16, height _0  (1.88 m), weight 235 lb 8 oz (106.8 kg), SpO2 96 %.  Wt Readings from Last 3 Encounters:  12/21/16 235 lb 8 oz (106.8 kg)  11/11/12 228 lb (103.4 kg)  11/03/12 221 lb 8 oz (100.5 kg)    PE: General:Pleasant affect, NAD Skin:Warm and dry, brisk capillary refill HEENT:normocephalic, sclera clear, mucus membranes moist Neck:supple, no JVD, no bruits  Heart:S1S2 RRR without murmur, gallup, rub or click Lungs:clear, ant  without rales, rhonchi, or wheezes JGG:EZMO, non tender, + BS, do not palpate liver spleen or masses Ext:no lower ext edema, 2+ pedal pulses, 2+ radial pulses Neuro:alert and oriented X 3, MAE, follows commands, + facial symmetry   Assessment/Plan Syncope with acute neuro  changes.  Neuro to see this is one of first episodes of syncope since PPM  CT head neg for bleed. May need to check orthostatic BP as well.  Will check Echo.    Chest pain with Hx of CAD > 20 yrs ago and last nuc 06/2016 no ischemia but he tells me he had racing HR post test.  Pt is on toprol, initial troponin POC was neg, if troponin climbs would need cath.  May be prudent to do one to check grafts. Also DOE.   PAF on Eliquis ,(though on hold)  Currently in SR.  Hx Stokes adams syncope but after PPM he has not had syncope. Will check PPM.   Cecilie Kicks  Nurse Practitioner Certified [place] Pager [phone] or after 5pm or weekends call [phone] 12/21/2016, 5:02 PM  The patient was seen, examined and discussed with Cecilie Kicks, NP and I agree with the above.   A very pleasant 66 year old male with h/o CAD s/p CABG in 1994, paroxysmal a-fib , tachy-brady syndrome, s/p St Jude PM implantation, HTN, anxiety, DM, GERD, OSA on CPAP machine. The patient has been followed in [facility]. He has been experiencing worsening fatigue and progressively worsening DOE, now with minima activity underwent a stress test on June 18 2016 that was negative for ischemia. Today he woke up with stabbing 10/10 retrosternal chest pain radiating to his neck that was followed by syncope. He states that prior to his CABG his presentation was a syncopal episode. Head CT was negative for acute stroke  or bleeding. Eliquis has been held. ECG shows SB with intermittent atrial pacing, no ischemic changes.  Plan: We will cyle troponin as there is only one POC troponin from 10 am today.  Obtain echocardiogram. Schedule for a cardiac catheterization - as the patient has symptoms and his CABG is over 72 years old. Crea 0.9. Place on iv Heparin.  Continue rosuvastatin, start aspirin 81 mg po daily and losartan 25 mg po daily.   Ena Dawley, MD 12/21/2016

## 2016-12-21 NOTE — ED Triage Notes (Signed)
[facility] for evaluation of new onset central chest pressure with intermittent stabbing and slurred speech that was present on awakening this morning per EMS. Pt has pacemaker present, hx of CABG. Pt is a/o x3, per EMS wife reports this is not his baseline. No neuro deficits present at current time. VSS per EMS.

## 2016-12-21 NOTE — Progress Notes (Signed)
MEDICATION RELATED CONSULT NOTE - INITIAL   Pharmacy Consult to confirm klonopin dosing with patient/pharmacy (med rec had him at 2mg  BID, but [place] controlled substance database looked like 1mg  tablets #60 per month).   Allergies  Allergen Reactions  . Nitroglycerin Palpitations  . Cyclobenzaprine Other (See Comments)    Dry mouth  . Niacin Other (See Comments)    flushing  . Tizanidine Other (See Comments)    Dry mouth    No results found for this or any previous visit (from the past 720 hour(s)).  Medical History: Past Medical History:  Diagnosis Date  . Anxiety   . Arthritis   . Complication of anesthesia    Takes alot to sedate patient.  . Coronary artery disease   . Diabetes mellitus with neuropathy (HCC)    A1C 5.0  . GERD (gastroesophageal reflux disease)    corrected with Hital Hernia  . H/O hiatal hernia    corrected  . ZOXWRUEA(540.9Headache(784.0)    sees a neuroologist- Dr. Celine Mansebra Kirby  . Hypertension   . Mental disorder    OCD.     [name]. Paroxysmal a-fib (HCC)    Pacemaker in place  . Presence of permanent cardiac pacemaker   . Sleep apnea    USES CPAP  . TIA (transient ischemic attack)     Medications:  Prescriptions Prior to Admission  Medication Sig Dispense Refill Last Dose  . apixaban (ELIQUIS) 5 MG TABS tablet Take 5 mg by mouth 2 (two) times daily.   12/20/2016 at 2030  . ARIPiprazole (ABILIFY) 5 MG tablet Take 5 mg by mouth at bedtime as needed (sleep).   Past Month at Unknown time  . buPROPion (WELLBUTRIN SR) 100 MG 12 hr tablet Take 100 mg by mouth daily.   12/20/2016 at Unknown time  . cholecalciferol (VITAMIN D) 1000 units tablet Take 2,000 Units by mouth daily.   12/20/2016 at Unknown time  . clonazePAM (KLONOPIN) 1 MG tablet Take 1 mg by mouth 2 (two) times daily. Name brand only    12/20/2016 at Unknown time  . divalproex (DEPAKOTE) 500 MG DR tablet Take 750 mg by mouth 3 (three) times daily.    12/20/2016 at Unknown time  . escitalopram (LEXAPRO) 10 MG tablet  Take 10 mg by mouth every morning.   12/20/2016 at Unknown time  . esomeprazole (NEXIUM) 40 MG capsule Take 40 mg by mouth daily at 12 noon.   12/20/2016 at Unknown time  . metoprolol succinate (TOPROL-XL) 50 MG 24 hr tablet Take 50 mg by mouth daily. Take with or immediately following a meal.   12/20/2016 at 0900  . oxycodone (OXY-IR) 5 MG capsule Take 5 mg by mouth every 4 (four) hours as needed for pain.   Past Month at Unknown time  . pregabalin (LYRICA) 75 MG capsule Take 75 mg by mouth 2 (two) times daily.   12/20/2016 at Unknown time  . rosuvastatin (CRESTOR) 10 MG tablet Take 10 mg by mouth every evening.   12/20/2016 at Unknown time  . acetaminophen (TYLENOL) 650 MG CR tablet Take 650 mg by mouth 2 (two) times daily as needed. For pain   11/09/2012 at Unknown  . HYDROcodone-acetaminophen (NORCO/VICODIN) 5-325 MG per tablet Take 1-2 tablets by mouth every 4 (four) hours as needed. (Patient not taking: Reported on 12/21/2016) 90 tablet 1 Not Taking at Unknown time  . Multiple Vitamin (MULTIVITAMIN WITH MINERALS) TABS Take 1 tablet by mouth every morning.   11/09/2012 at Unknown  .  niacin 500 MG CR capsule Take 1,500 mg by mouth at bedtime.   Not Taking at Unknown time    Assessment/Plan:  I confirmed with patient, his spouse, and [facility]' pharmacy: Klonopin dose is 1mg  po BID, name brand only. ( In the past he was taking 2mg  BID).  I corrected the PTA medication list.  Pharmacy is dispensing the patient's own medication since we do not stock brand name Klonopin.  Noah Delaine, RPh Clinical Pharmacist Pager: [phone] 12/21/2016,10:29 PM

## 2016-12-21 NOTE — Progress Notes (Signed)
Called by pharmacy if okay to start IV heparin as per cardiology recommendations. Called neurology and discussed concerns of possible CVA/TIA with negative CT head imaging (not able to have MRI due to pacemaker for sick sinus syndrome). Per neurology that some small risk of hemorrhagic conversion if patient has infarct but given negative imaging and patient clinically back to baseline with active cardiac issue, okay to start IV heparin at this time.  Leland HerElsia J Flor Whitacre, DO PGY-1, [facility] 12/21/2016 8:18 PM

## 2016-12-22 ENCOUNTER — Observation Stay (HOSPITAL_COMMUNITY): Payer: Medicare Other

## 2016-12-22 DIAGNOSIS — I4819 Other persistent atrial fibrillation: Secondary | ICD-10-CM

## 2016-12-22 DIAGNOSIS — R079 Chest pain, unspecified: Secondary | ICD-10-CM

## 2016-12-22 DIAGNOSIS — R0789 Other chest pain: Principal | ICD-10-CM

## 2016-12-22 DIAGNOSIS — Z8673 Personal history of transient ischemic attack (TIA), and cerebral infarction without residual deficits: Secondary | ICD-10-CM | POA: Diagnosis not present

## 2016-12-22 DIAGNOSIS — F419 Anxiety disorder, unspecified: Secondary | ICD-10-CM | POA: Diagnosis present

## 2016-12-22 DIAGNOSIS — Z6830 Body mass index (BMI) 30.0-30.9, adult: Secondary | ICD-10-CM | POA: Diagnosis not present

## 2016-12-22 DIAGNOSIS — Z7901 Long term (current) use of anticoagulants: Secondary | ICD-10-CM | POA: Diagnosis not present

## 2016-12-22 DIAGNOSIS — Z950 Presence of cardiac pacemaker: Secondary | ICD-10-CM | POA: Diagnosis not present

## 2016-12-22 DIAGNOSIS — F429 Obsessive-compulsive disorder, unspecified: Secondary | ICD-10-CM | POA: Diagnosis present

## 2016-12-22 DIAGNOSIS — H8110 Benign paroxysmal vertigo, unspecified ear: Secondary | ICD-10-CM

## 2016-12-22 DIAGNOSIS — I480 Paroxysmal atrial fibrillation: Secondary | ICD-10-CM | POA: Diagnosis present

## 2016-12-22 DIAGNOSIS — G4733 Obstructive sleep apnea (adult) (pediatric): Secondary | ICD-10-CM | POA: Diagnosis present

## 2016-12-22 DIAGNOSIS — E785 Hyperlipidemia, unspecified: Secondary | ICD-10-CM | POA: Diagnosis present

## 2016-12-22 DIAGNOSIS — I2510 Atherosclerotic heart disease of native coronary artery without angina pectoris: Secondary | ICD-10-CM | POA: Diagnosis present

## 2016-12-22 DIAGNOSIS — E1140 Type 2 diabetes mellitus with diabetic neuropathy, unspecified: Secondary | ICD-10-CM | POA: Diagnosis present

## 2016-12-22 DIAGNOSIS — R55 Syncope and collapse: Secondary | ICD-10-CM | POA: Diagnosis present

## 2016-12-22 DIAGNOSIS — K219 Gastro-esophageal reflux disease without esophagitis: Secondary | ICD-10-CM | POA: Diagnosis present

## 2016-12-22 DIAGNOSIS — I481 Persistent atrial fibrillation: Secondary | ICD-10-CM | POA: Diagnosis not present

## 2016-12-22 DIAGNOSIS — E872 Acidosis: Secondary | ICD-10-CM | POA: Diagnosis present

## 2016-12-22 DIAGNOSIS — I10 Essential (primary) hypertension: Secondary | ICD-10-CM | POA: Diagnosis present

## 2016-12-22 DIAGNOSIS — E669 Obesity, unspecified: Secondary | ICD-10-CM | POA: Diagnosis present

## 2016-12-22 DIAGNOSIS — Z951 Presence of aortocoronary bypass graft: Secondary | ICD-10-CM | POA: Diagnosis not present

## 2016-12-22 LAB — ECHOCARDIOGRAM COMPLETE
CHL CUP MV DEC (S): 211
EERAT: 5.91
EWDT: 211 ms
FS: 35 % (ref 28–44)
HEIGHTINCHES: 74 in
IV/PV OW: 1.02
LADIAMINDEX: 1.67 cm/m2
LASIZE: 39 mm
LAVOL: 48.5 mL
LAVOLA4C: 56.4 mL
LAVOLIN: 20.7 mL/m2
LEFT ATRIUM END SYS DIAM: 39 mm
LV E/e'average: 5.91
LV PW d: 10.1 mm — AB (ref 0.6–1.1)
LV TDI E'LATERAL: 10.5
LV TDI E'MEDIAL: 5.74
LVEEMED: 5.91
LVELAT: 10.5 cm/s
LVOT area: 3.14 cm2
LVOT diameter: 20 mm
Lateral S' vel: 13.3 cm/s
MV pk A vel: 71.3 m/s
MV pk E vel: 62.1 m/s
TAPSE: 18 mm
WEIGHTICAEL: 3800 [oz_av]

## 2016-12-22 LAB — LIPID PANEL
CHOL/HDL RATIO: 3.5 ratio
CHOLESTEROL: 149 mg/dL (ref 0–200)
HDL: 43 mg/dL (ref >40)
LDL Cholesterol: 78 mg/dL (ref 0–99)
Triglycerides: 141 mg/dL (ref <150)
VLDL: 28 mg/dL (ref 0–40)

## 2016-12-22 LAB — VAS US CAROTID
LCCADDIAS: -15 cm/s
LEFT ECA DIAS: -22 cm/s
LEFT VERTEBRAL DIAS: 14 cm/s
LICAPDIAS: -21 cm/s
Left CCA dist sys: -70 cm/s
Left CCA prox dias: 24 cm/s
Left CCA prox sys: 135 cm/s
Left ICA dist dias: -20 cm/s
Left ICA dist sys: -67 cm/s
Left ICA prox sys: -71 cm/s
RCCADSYS: -111 cm/s
RCCAPDIAS: 20 cm/s
RIGHT ECA DIAS: -12 cm/s
RIGHT VERTEBRAL DIAS: 17 cm/s
Right CCA prox sys: 144 cm/s

## 2016-12-22 LAB — APTT: APTT: 101 s — AB (ref 24–36)

## 2016-12-22 LAB — GLUCOSE, CAPILLARY: GLUCOSE-CAPILLARY: 153 mg/dL — AB (ref 65–99)

## 2016-12-22 LAB — RAPID URINE DRUG SCREEN, HOSP PERFORMED
AMPHETAMINES: NOT DETECTED
BENZODIAZEPINES: NOT DETECTED
Barbiturates: NOT DETECTED
Cocaine: NOT DETECTED
OPIATES: NOT DETECTED
Tetrahydrocannabinol: NOT DETECTED

## 2016-12-22 LAB — CBC
HEMATOCRIT: 43.1 % (ref 39.0–52.0)
HEMOGLOBIN: 14.6 g/dL (ref 13.0–17.0)
MCH: 31.6 pg (ref 26.0–34.0)
MCHC: 33.9 g/dL (ref 30.0–36.0)
MCV: 93.3 fL (ref 78.0–100.0)
Platelets: 127 10*3/uL — ABNORMAL LOW (ref 150–400)
RBC: 4.62 MIL/uL (ref 4.22–5.81)
RDW: 14.2 % (ref 11.5–15.5)
WBC: 5.6 10*3/uL (ref 4.0–10.5)

## 2016-12-22 LAB — TROPONIN I: Troponin I: <0.03 ng/mL (ref <0.03)

## 2016-12-22 LAB — HEPARIN LEVEL (UNFRACTIONATED): HEPARIN UNFRACTIONATED: 0.51 [IU]/mL (ref 0.30–0.70)

## 2016-12-22 MED ORDER — APIXABAN 5 MG PO TABS
5.0000 mg | ORAL_TABLET | Freq: Two times a day (BID) | ORAL | Status: DC
  Administered 2016-12-22: 5 mg via ORAL
  Filled 2016-12-22: qty 1

## 2016-12-22 NOTE — Discharge Summary (Signed)
Family Medicine Teaching [facility] Discharge Summary  Patient name: Edward Arellano Medical record number: [id number] Date of birth: 07/15/1951 Age: 66 y.o. Gender: male Date of Admission: 12/21/2016  Date of Discharge: 12/22/2016 Admitting Physician: Moses Manners, MD  Primary Care Provider: Elspeth Cho, MD Consultants: Cardiology, neurology  Indication for Hospitalization: Chest pain with syncope  Discharge Diagnoses/Problem List:  Syncope without neurological deficit Noncardiac chest pain CAD s/p CABG x4 with pacemaker Hypertension Paroxysmal atrial fibrillation Hyperlipidemia History of non-insulin dependent diabetes mellitus GERD Anxiety Peripheral neuropathy  Disposition: Home  Discharge Condition: Stable, improved  Discharge Exam:  General: Obese, well nourished, well developed, in no acute distress with non-toxic appearance HEENT: normocephalic, atraumatic, moist mucous membranes CV: regular rate and rhythm without murmurs, rubs, or gallops, no edema Lungs: clear to auscultation bilaterally with normal work of breathing Abdomen: soft, non-tender, normoactive bowel sounds Skin: warm, dry, no rashes or lesions, cap refill < 2 seconds Extremities: warm and well perfused, normal tone, 5/5 motor strength all 4 extremities Neuro: CNII-XII intact, slurring of speech, no facial droop  Brief Hospital Course:  Edward Barabas [name] a 66 y.o.malepresenting with chest pain and syncope. 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  Impression presented with syncopal episode at his home followed by intermittent, self resolving chest pain. He subsequently experienced third speech and blurred vision and called EMS. This was the patient's first syncopal episode since receiving a pacemaker  4 years ago, and previously had syncopal episodes prior to this. Patient endorsed vertigo and headache but could not recall trauma from fall.  In ED, patient presented in a confused state with slow speech without dysarthria. No cranial nerve deficits appreciated. Motor strength 5/5 on all extremities. Gait was unstable, and finger to nose slow but successful. Head CT and cervical spine negative. Given pacemaker, MRI was unable to be obtained. Neurology believed this was secondary to possible hyperperfusion but did not see need for further neurological workup given complete resolution of symptoms without neurological deficits. EKG atrial paced otherwise negative. Troponins negative. Cardiology consult and initially started heparin gtt for PAF concerning possible need for cath. However, chest pain was found to be MSK related and did not warrant further ischemia workup given negative labs and studies with resolution of chest pain. Patient remained stable and safe for discharge with outpatient PT vestibular rehabilitation.  Issues for Follow Up:  1. Neurology recommends following up with your neurologist. There were no signs of stroke/TIA with complete resolution of neurological status. 2. Cardiology recommended align up with your cardiologist. Continue your statin therapy and Eliquis. Chest pain thought to be MSK in nature and not cardiac related. 3. PT recommended outpatient vestibular rehabilitation upon discharge given recurrent vertigo.  4. Patient is to follow-up with psychiatrist weekly as many of the symptoms may be related to anxiety.  Significant Procedures: None  Significant Labs and Imaging:   Recent Labs Lab 12/21/16 1015 12/22/16 0600  WBC 4.7 5.6  HGB 15.9 14.6  HCT 46.2 43.1  PLT 128* 127*    Recent Labs Lab 12/21/16 1015  NA 144  K 4.3  CL 108  CO2 28  GLUCOSE 97  BUN 8  CREATININE 0.90  CALCIUM 9.1   Troponin: Negative 3 Lactic acidosis: 3.09>2.90>2.80 A1c:  Pending  CT HEAD + CERVICAL SPINE WO CONTRAST (12/21/2016)  FINDINGS: CT HEAD FINDINGS Brain: Moderate atrophy. Mild chronic microvascular ischemic change in the white matter. Negative for acute infarct. Negative for hemorrhage or mass. No shift of the midline structures. Vascular: Mild hyperdensity in the distal internal carotid artery right greater than left unchanged from the prior study most likely due to atherosclerotic disease. Negative for dense MCA. Skull: Negative Sinuses/Orbits: Negative Other: None  CT CERVICAL SPINE FINDINGS Alignment: Normal Skull base and vertebrae: Negative for fracture. Soft tissues and spinal canal: Negative Disc levels: Mild disc and facet degeneration in the cervical spine. Upper chest: Negative Other: None  IMPRESSION: Atrophy and chronic microvascular ischemia. No acute intracranial abnormality. Negative for cervical spine fracture.  DG Chest 2 View (12/21/2016) FINDINGS: The patient is status post CABG with a pacing device in place. Heart size is upper normal. Lungs are clear. No pneumothorax or pleural fluid. No acute bony abnormality.  IMPRESSION: No acute disease.  Results/Tests Pending at Time of Discharge: A1c, echocardiogram, carotid Doppler  Discharge Medications:  Allergies as of 12/22/2016      Reactions   Nitroglycerin Palpitations   Cyclobenzaprine Other (See Comments)   Dry mouth   Niacin Other (See Comments)   flushing   Tizanidine Other (See Comments)   Dry mouth      Medication List    STOP taking these medications   HYDROcodone-acetaminophen 5-325 MG tablet Commonly known as:  NORCO/VICODIN   oxycodone 5 MG capsule Commonly known as:  OXY-IR     TAKE these medications   acetaminophen 650 MG CR tablet Commonly known as:  TYLENOL Take 650 mg by mouth 2 (two) times daily as needed. For pain   ARIPiprazole 5 MG tablet Commonly known as:  ABILIFY Take 5 mg by mouth at bedtime as needed (sleep).    buPROPion 100 MG 12 hr tablet Commonly known as:  WELLBUTRIN SR Take 100 mg by mouth daily.   cholecalciferol 1000 units tablet Commonly known as:  VITAMIN D Take 2,000 Units by mouth daily.   divalproex 500 MG DR tablet Commonly known as:  DEPAKOTE Take 750 mg by mouth 3 (three) times daily.   ELIQUIS 5 MG Tabs tablet Generic drug:  apixaban Take 5 mg by mouth 2 (two) times daily.   escitalopram 10 MG tablet Commonly known as:  LEXAPRO Take 10 mg by mouth every morning.   esomeprazole 40 MG capsule Commonly known as:  NEXIUM Take 40 mg by mouth daily at 12 noon.   KLONOPIN 1 MG tablet Generic drug:  clonazePAM Take 1 mg by mouth 2 (two) times daily. Name brand only   metoprolol succinate 50 MG 24 hr tablet Commonly known as:  TOPROL-XL Take 50 mg by mouth daily. Take with or immediately following a meal.   multivitamin with minerals Tabs tablet Take 1 tablet by mouth every morning.   niacin 500 MG CR capsule Take 1,500 mg by mouth at bedtime.   pregabalin 75 MG capsule Commonly known as:  LYRICA Take 75 mg by mouth 2 (two) times daily.   rosuvastatin 10 MG tablet Commonly known as:  CRESTOR Take 10 mg by mouth every evening.       Discharge Instructions: Please refer to Patient Instructions section of EMR for full details.  Patient was counseled important signs and symptoms that should prompt return to medical care, changes in medications, dietary instructions, activity restrictions, and follow up appointments.   Follow-Up Appointments: Follow-up Information    Elspeth ChoERRELL,GRACE E, MD. Go on 12/28/2016.  Specialty:  Internal Medicine Why:  Please go to appointment as scheduled. Contact information: [address] [place] [address] [phone]           Wendee Beavers, DO 12/22/2016, 4:02 PM PGY-1, [facility]

## 2016-12-22 NOTE — Progress Notes (Signed)
  Echocardiogram 2D Echocardiogram has been performed.  Edward Arellano, Edward Arellano 12/22/2016, 9:49 AM

## 2016-12-22 NOTE — Discharge Instructions (Signed)
Your admitted for a episode of fainting and chest pain. Both of these issues were addressed with neurology and cardiology respectively.   CT of her head and neck did not show any signs of stroke or pathology. Neurology did not see a need for further imaging or workup given reassuring neurological status.  Your heart labs and EKG were reassuring and did not show any signs of heart damage. Cardiology checked your pacemaker and and did not find evidence of malfunction. There was no further need for heart workup given these findings.  Please call and make a hospital follow-up with your PCP for next week.

## 2016-12-22 NOTE — Progress Notes (Signed)
Progress Note  Patient Name: Edward Arellano Date of Encounter: 12/22/2016  Primary Cardiologist: Erling Cruz of [facility], Dr. Rudolpho Sevin  Subjective   Feeling better, no specific complaints. No chest pain.  Inpatient Medications    Scheduled Meds: .  stroke: mapping our early stages of recovery book   Does not apply Once  . aspirin  81 mg Oral Once  . buPROPion  100 mg Oral Daily  . cholecalciferol  2,000 Units Oral Daily  . clonazePAM  1 mg Oral BID  . divalproex  750 mg Oral TID  . escitalopram  10 mg Oral q morning - 10a  . losartan  25 mg Oral Daily  . pantoprazole  80 mg Oral Q1200  . pregabalin  75 mg Oral BID  . rosuvastatin  10 mg Oral QPM   Continuous Infusions: . sodium chloride 125 mL/hr at 12/21/16 2226  . heparin 1,450 Units/hr (12/21/16 2226)   PRN Meds: acetaminophen **OR** acetaminophen (TYLENOL) oral liquid 160 mg/5 mL **OR** acetaminophen, ARIPiprazole, senna-docusate   Vital Signs    Vitals:   12/21/16 1800 12/21/16 2100 12/22/16 0300 12/22/16 0500  BP:  119/83 140/88 140/88  Pulse:  72 84 83  Resp:  19 19 20   Temp: 98.1 F (36.7 C) 97.7 F (36.5 C)  98 F (36.7 C)  TempSrc:      SpO2:  96% 96% 94%  Weight:    237 lb 8 oz (107.7 kg)  Height:        Intake/Output Summary (Last 24 hours) at 12/22/16 0827 Last data filed at 12/22/16 0600  Gross per 24 hour  Intake          2015.55 ml  Output              300 ml  Net          1715.55 ml   Filed Weights   12/21/16 1543 12/22/16 0500  Weight: 235 lb 8 oz (106.8 kg) 237 lb 8 oz (107.7 kg)    Telemetry    No adverse arrhythmias detected, no pauses - Personally Reviewed  ECG    Sinus rhythm, no acute changes - Personally Reviewed  Physical Exam   GEN: No acute distress.  Neck: No JVD Cardiac: RRR, no murmurs, rubs, or gallops.  Respiratory: Clear to auscultation bilaterally. GI: Soft, nontender, non-distended  MS: No edema; No deformity. Neuro:  AAOx3. Psych: Normal  affect  Labs    Chemistry Recent Labs Lab 12/21/16 1015  NA 144  K 4.3  CL 108  CO2 28  GLUCOSE 97  BUN 8  CREATININE 0.90  CALCIUM 9.1  GFRNONAA >60  GFRAA >60  ANIONGAP 8     Hematology Recent Labs Lab 12/21/16 1015 12/22/16 0600  WBC 4.7 5.6  RBC 4.98 4.62  HGB 15.9 14.6  HCT 46.2 43.1  MCV 92.8 93.3  MCH 31.9 31.6  MCHC 34.4 33.9  RDW 14.0 14.2  PLT 128* 127*    Cardiac Enzymes Recent Labs Lab 12/21/16 1829 12/21/16 2148 12/22/16 0600  TROPONINI <0.03 <0.03 <0.03    Recent Labs Lab 12/21/16 1042  TROPIPOC 0.01     BNPNo results for input(s): BNP, PROBNP in the last 168 hours.   DDimer No results for input(s): DDIMER in the last 168 hours.   Radiology    Dg Chest 2 View  Result Date: 12/21/2016 CLINICAL DATA:  Left chest pain radiating into the left arm today. EXAM: CHEST  2 VIEW COMPARISON:  PA and lateral chest 12/04/2016 and 01/05/2016. FINDINGS: The patient is status post CABG with a pacing device in place. Heart size is upper normal. Lungs are clear. No pneumothorax or pleural fluid. No acute bony abnormality. IMPRESSION: No acute disease. Electronically Signed   By: Drusilla Kanner M.D.   On: 12/21/2016 10:52   Ct Head Wo Contrast  Result Date: 12/21/2016 CLINICAL DATA:  Syncope.  Neck pain EXAM: CT HEAD WITHOUT CONTRAST CT CERVICAL SPINE WITHOUT CONTRAST TECHNIQUE: Multidetector CT imaging of the head and cervical spine was performed following the standard protocol without intravenous contrast. Multiplanar CT image reconstructions of the cervical spine were also generated. COMPARISON:  CT head 01/05/2016 FINDINGS: CT HEAD FINDINGS Brain: Moderate atrophy. Mild chronic microvascular ischemic change in the white matter. Negative for acute infarct. Negative for hemorrhage or mass. No shift of the midline structures. Vascular: Mild hyperdensity in the distal internal carotid artery right greater than left unchanged from the prior study most likely  due to atherosclerotic disease. Negative for dense MCA. Skull: Negative Sinuses/Orbits: Negative Other: None CT CERVICAL SPINE FINDINGS Alignment: Normal Skull base and vertebrae: Negative for fracture. Soft tissues and spinal canal: Negative Disc levels: Mild disc and facet degeneration in the cervical spine. Upper chest: Negative Other: None IMPRESSION: Atrophy and chronic microvascular ischemia. No acute intracranial abnormality. Negative for cervical spine fracture. Electronically Signed   By: Marlan Palau M.D.   On: 12/21/2016 17:00   Ct Cervical Spine Wo Contrast  Result Date: 12/21/2016 CLINICAL DATA:  Syncope.  Neck pain EXAM: CT HEAD WITHOUT CONTRAST CT CERVICAL SPINE WITHOUT CONTRAST TECHNIQUE: Multidetector CT imaging of the head and cervical spine was performed following the standard protocol without intravenous contrast. Multiplanar CT image reconstructions of the cervical spine were also generated. COMPARISON:  CT head 01/05/2016 FINDINGS: CT HEAD FINDINGS Brain: Moderate atrophy. Mild chronic microvascular ischemic change in the white matter. Negative for acute infarct. Negative for hemorrhage or mass. No shift of the midline structures. Vascular: Mild hyperdensity in the distal internal carotid artery right greater than left unchanged from the prior study most likely due to atherosclerotic disease. Negative for dense MCA. Skull: Negative Sinuses/Orbits: Negative Other: None CT CERVICAL SPINE FINDINGS Alignment: Normal Skull base and vertebrae: Negative for fracture. Soft tissues and spinal canal: Negative Disc levels: Mild disc and facet degeneration in the cervical spine. Upper chest: Negative Other: None IMPRESSION: Atrophy and chronic microvascular ischemia. No acute intracranial abnormality. Negative for cervical spine fracture. Electronically Signed   By: Marlan Palau M.D.   On: 12/21/2016 17:00    Cardiac Studies   Nuclear stress test 06/08/16-no ischemia, normal EF   Patient  Profile     66 y.o. male post CABG 4 greater than 20 years ago, CAD, [employer] pacemaker for sick sinus syndrome, paroxysmal atrial fibrillation on requests, vertigo, hyperlipidemia, admitted with recurrent syncope. 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 He has had evidence of global amnesia in the past. Transferred from [place], chest pain, slurred speech. Fall, mental status changes. Had sharp chest discomfort still up, dizzy, fainted. No shortness of breath.  Assessment & Plan    Atypical chest pain  - Troponin is normal. EKG unremarkable. Sharp in Editor, commissioning.Likely musculoskeletal. He remembers it feeling slightly changed when in the ambulance, shaking, moving.  - Recent nuclear stress test in July negative for ischemia.  - Worsening fatigue, worsening dyspnea on exertion.  - He has never experienced chest pain before.  - Overall  reassured by negative troponins. Obviously if pain was as severe as he stated and lasted for over 30 minutes we would've seen evidence of troponin elevation if this were acute coronary syndrome.  - With his recent nuclear stress test in July, no ischemia I do not feel that he needs any further cardiac catheterization given the atypical nature of his pain.  - Since troponins are normal, we will go ahead and stop heparin.  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  - No adverse arrhythmias on monitor. Pacemaker for backup.  Elevated lactic acid  - Per primary team.  We will follow along.   Donato SchultzMark Skains, MD  12/22/2016, 8:27 AM

## 2016-12-22 NOTE — Progress Notes (Signed)
Family Medicine Teaching Service Daily Progress Note Intern Pager: [phone]  Patient name: Edward Arellano Medical record number: [id number] Date of birth: 02/13/1951 Age: 66 y.o. Gender: male  Primary Care Provider: Elspeth ChoERRELL,GRACE E, MD Consultants: Cardiology, neurology Code Status: Full  Pt Overview and Major Events to Date:  01/19: Admit for chest pain with syncope and AMS, initial head CT negative for stroke, unable to obtain MRI given pacemaker 01/20: Neuro recommends repeat CT head on 1/21, cards recommends no further ischemia workup  Assessment and Plan: Edward Arellano is a 66 y.o. male presenting with chest pain and syncope. 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  #Syncope with acute neurologic change: Acute, stable. Initial head CT negative for stroke. Unable to obtain head MRI given pacemaker. Stroke/TIA unlikely given stable neurological status without notable deficits. Neurology following believes possible hypoperfusion. Given chest pain was suspected to be from ischemia however ACS workup unremarkable thus far. Neurology recommended repeat CT in 24 hours on 1/21. Other possibilities include PAF patient remains rate stable since admission. EKG unremarkable. --Repeat head CT in 24 hours on 1/21 per neurology recommendations --Eliquis 5 mg BID --Cardiac monitoring --PT/OT/SLP, no SLP or OT f/u, PT pending --Carotid ultrasound pending  --Echocardiogram pending --Neurology consult, appreciate recommendations --Orthostatic VS pending  #Chest pain in the setting of CAD s/p CABG x4 and sick sinus syndrome s/p pacemaker: Acute, resolved. Sharp stabbing substernal pain that occurred at rest this morning. Last lexiscan myoview on 06/08/16 shows EF 55%, no clinical ischemia. HEART score 6. POC  trop 0.01 and EKG shows no ST changes. Troponin negative 3. Cardiology without recommendations for further ischemia workup. --Cardiac monitoring --Toprol-XL 50 mg QD --Cardiology consult, appreciate recommendations --Echocardiogram pending --Ultrasound pending  #Hypertension: Chronic, stable. Only on metoprolol at home currently --Continue monitoring BP --Toprol-XL 50 mg QD  #Paroxysmal atrial fibrillation: Chronic, stable. On eliquis and metoprolol at home. EKG remains rate controlled. --Toprol-XL 50 mg QD --Eliquis 5 mg BID --Cardiac monitoring  #Hyperlipidemia: Chronic, stable. On Crestor 10 mg QD. Last lipid panel unremarkable on hospitalization. ASCVD risk 16.6% meeting mod-high intensity statin. --Crestor 10 mg QD  #H/o non-insulin dependent diabetes mellitus: Chronic, stable. Per care everywhere past several a1c have been 5.7 -5.5. Has not been on any home medications recently. --A1c pending  #GERD:  Chronic, stable. At home on esomeprazole. --Protonix 80 mg QD  #Anxiety:  Chronic, stable. At home on lexapro, wellbutrin, abilify --Lexapro 10 mg QD --Wellbutrin 100 mg QD --Abilify 5 mg PRN QHS  #Idiopathic: Chronic, stable. Neuropathy at home on lyrica. --Lyrica 75 mg BID  FEN/GI: Heart healthy/carb modified diet, NS @125  mL/hr, protonix Prophylaxis: Eliquis 5 mg BID for PAF  Disposition: Pending repeat 24-hour CT of head on 1/21.  Subjective:  Patient says he feels great this morning. No longer having chest pain since yesterday. Denies outpatient dictations, shortness of breath, nausea or vomiting, or lightheadedness.  Objective: Temp:  [97.7 F (36.5 C)-98.1 F (36.7 C)] 98 F (36.7 C) (01/20 0500) Pulse Rate:  [65-84] 83 (01/20 0500) Resp:  [14-20] 20 (01/20 0500) BP: (105-155)/(77-92) 140/88 (01/20 0500) SpO2:  [89 %-96 %] 94 % (01/20 0500) Weight:  [235 lb 8 oz (106.8 kg)-237 lb 8 oz (107.7 kg)] 237 lb 8 oz (107.7 kg) (01/20 0500) Physical  Exam: General: Obese, well nourished,  well developed, in no acute distress with non-toxic appearance HEENT: normocephalic, atraumatic, moist mucous membranes CV: regular rate and rhythm without murmurs, rubs, or gallops, no edema Lungs: clear to auscultation bilaterally with normal work of breathing Abdomen: soft, non-tender, normoactive bowel sounds Skin: warm, dry, no rashes or lesions, cap refill < 2 seconds Extremities: warm and well perfused, normal tone  Laboratory:  Recent Labs Lab 12/21/16 1015 12/22/16 0600  WBC 4.7 5.6  HGB 15.9 14.6  HCT 46.2 43.1  PLT 128* 127*    Recent Labs Lab 12/21/16 1015  NA 144  K 4.3  CL 108  CO2 28  BUN 8  CREATININE 0.90  CALCIUM 9.1  GLUCOSE 97   Urinalysis    Component Value Date/Time   COLORURINE YELLOW 12/21/2016 1119   APPEARANCEUR CLEAR 12/21/2016 1119   LABSPEC 1.015 12/21/2016 1119   PHURINE 6.0 12/21/2016 1119   GLUCOSEU NEGATIVE 12/21/2016 1119   HGBUR NEGATIVE 12/21/2016 1119   BILIRUBINUR NEGATIVE 12/21/2016 1119   KETONESUR NEGATIVE 12/21/2016 1119   PROTEINUR NEGATIVE 12/21/2016 1119   NITRITE NEGATIVE 12/21/2016 1119   LEUKOCYTESUR NEGATIVE 12/21/2016 1119   Lipid Panel     Component Value Date/Time   CHOL 149 12/22/2016 0559   TRIG 141 12/22/2016 0559   HDL 43 12/22/2016 0559   CHOLHDL 3.5 12/22/2016 0559   VLDL 28 12/22/2016 0559   LDLCALC 78 12/22/2016 0559   Troponin: Negative 3 Lactic acidosis: 3.09>2.90>2.80 UDS: Pending A1c: Pending  Imaging/Diagnostic Tests: DG Chest 2 View (12/21/2016) FINDINGS: The patient is status post CABG with a pacing device in place. Heart size is upper normal. Lungs are clear. No pneumothorax or pleural fluid. No acute bony abnormality.  IMPRESSION: No acute disease.  CT HEAD + CERVICAL SPINE WO CONTRAST (12/21/2016) FINDINGS: CT HEAD FINDINGS Brain: Moderate atrophy. Mild chronic microvascular ischemic change in the white matter. Negative for acute  infarct. Negative for hemorrhage or mass. No shift of the midline structures. Vascular: Mild hyperdensity in the distal internal carotid artery right greater than left unchanged from the prior study most likely due to atherosclerotic disease. Negative for dense MCA. Skull: Negative Sinuses/Orbits: Negative Other: None  CT CERVICAL SPINE FINDINGS Alignment: Normal Skull base and vertebrae: Negative for fracture. Soft tissues and spinal canal: Negative Disc levels: Mild disc and facet degeneration in the cervical spine. Upper chest: Negative Other: None  IMPRESSION: Atrophy and chronic microvascular ischemia. No acute intracranial abnormality. Negative for cervical spine fracture.   Wendee Beavers, DO 12/22/2016, 11:33 AM PGY-1, [facility] FPTS Intern pager: [phone], text pages welcome

## 2016-12-22 NOTE — Evaluation (Addendum)
Physical Therapy Evaluation and Discharge Patient Details Name: Edward Arellano MRN: [id number] DOB: 02-18-1951 Today's Date: 12/22/2016   History of Present Illness  66 y.o. admitted for chest pain, slurred speech and syncopal episode at home. PMH includes CAD s/p CABG, afib, ED, HTN, OSA on CPAP, prior stroke, T2DM, HLD, GERD, GAD/somatiform disorder, BPPV, chronic migraine, prior TIA, peripheral neuropathy, pacemaker.  Clinical Impression  Patient evaluated by Physical Therapy with no further acute PT needs identified. All education has been completed and the patient has no further questions. He complained of spinning type vertigo with bending forward prior to falling at home, as well as when he rolls over in bed. Patient tested positive with left dix-hallpike testing, demonstrating upward beating left rotational nystagmus <60 seconds; treated with Epley Maneuver for Left posterior canalithiasis. Hx of BPPV and positive today, however this would not be associated with his chest pain. No significant instability noted with gait and vitals were stable throughout bout.Good family support. See below for any follow-up Physical Therapy or equipment needs. PT is signing off. Thank you for this referral.     Follow Up Recommendations Outpatient PT (vestibular rehab if vertigo persists after Tx (wife aware))    Equipment Recommendations  None recommended by PT    Recommendations for Other Services       Precautions / Restrictions Precautions Precautions: Fall Restrictions Weight Bearing Restrictions: No      Mobility  Bed Mobility Overal bed mobility: Needs Assistance Bed Mobility: Supine to Sit;Sit to Supine     Supine to sit: Supervision Sit to supine: Supervision      Transfers Overall transfer level: Needs assistance   Transfers: Sit to/from Stand Sit to Stand: Supervision         General transfer comment: supervision for safety, no significant difficulty but slow and  cautious to rise.  Ambulation/Gait Ambulation/Gait assistance: Supervision Ambulation Distance (Feet): 200 Feet Assistive device: None Gait Pattern/deviations: Step-through pattern;Decreased stride length;Drifts right/left Gait velocity: Decreased Gait velocity interpretation: Below normal speed for age/gender General Gait Details: Minimal sway noted with gait, slow with turns but without loss of balance. Supervision for safety. VSS throughout ambulatory bout.   Stairs Stairs:  (Declined to practice, pt and wife feel confident in ability)          Wheelchair Mobility    Modified Rankin (Stroke Patients Only) Modified Rankin (Stroke Patients Only) Pre-Morbid Rankin Score: Slight disability Modified Rankin: Moderate disability     Balance Overall balance assessment: Modified Independent                                           Pertinent Vitals/Pain Pain Assessment: No/denies pain Pain Score: 0-No pain Pain Location: back and IV site in Rt arm Pain Descriptors / Indicators: Burning;Sore Pain Intervention(s): Monitored during session    Home Living Family/patient expects to be discharged to:: Private residence Living Arrangements: Spouse/significant other Available Help at Discharge: Family;Available 24 hours/day Type of Home: House Home Access: Stairs to enter Entrance Stairs-Rails: None Entrance Stairs-Number of Steps: 2 Home Layout: One level Home Equipment: Bedside commode      Prior Function Level of Independence: Independent               Hand Dominance        Extremity/Trunk Assessment   Upper Extremity Assessment Upper Extremity Assessment: Defer to OT evaluation RUE Coordination: decreased  fine motor LUE Coordination: decreased fine motor    Lower Extremity Assessment Lower Extremity Assessment: Overall WFL for tasks assessed       Communication   Communication: No difficulties  Cognition Arousal/Alertness:  Awake/alert Behavior During Therapy: WFL for tasks assessed/performed Overall Cognitive Status:  (seemed to have slow processing-unsure of baseline)                      General Comments General comments (skin integrity, edema, etc.): Patient complained of spinning type vertigo when rising from bed and when rolling over in bed. He tested positive with Lt dix hallpike test and Demonstrated upward beating left rotational nystagmus. Treated with Eple's manever for Lt posterior canalithiasis.     Exercises     Assessment/Plan    PT Assessment Patent does not need any further PT services  PT Problem List            PT Treatment Interventions      PT Goals (Current goals can be found in the Care Plan section)  Acute Rehab PT Goals Patient Stated Goal: not stated PT Goal Formulation: All assessment and education complete, DC therapy    Frequency     Barriers to discharge        Co-evaluation               End of Session   Activity Tolerance: Patient tolerated treatment well Patient left: in chair;with call bell/phone within reach;with family/visitor present Nurse Communication: Mobility status    Functional Assessment Tool Used: Clinical Observation Functional Limitation: Mobility: Walking and moving around Mobility: Walking and Moving Around Current Status ([id number]): At least 1 percent but less than 20 percent impaired, limited or restricted Mobility: Walking and Moving Around Goal Status [phone]([id number]): At least 1 percent but less than 20 percent impaired, limited or restricted Mobility: Walking and Moving Around Discharge Status [phone]([phone]): At least 1 percent but less than 20 percent impaired, limited or restricted    Time: 1137-1213 PT Time Calculation (Edward) (ACUTE ONLY): 36 Edward   Charges:   PT Evaluation $PT Eval Low Complexity: 1 Procedure PT Treatments $Canalith Rep Proc: 8-22 mins   PT G Codes:   PT G-Codes **NOT FOR INPATIENT CLASS** Functional Assessment  Tool Used: Clinical Observation Functional Limitation: Mobility: Walking and moving around Mobility: Walking and Moving Around Current Status ([id number]): At least 1 percent but less than 20 percent impaired, limited or restricted Mobility: Walking and Moving Around Goal Status [phone]([id number]): At least 1 percent but less than 20 percent impaired, limited or restricted Mobility: Walking and Moving Around Discharge Status [phone]([id number]): At least 1 percent but less than 20 percent impaired, limited or restricted    Berton MountLogan S Janssen Zee 12/22/2016, 12:39 PM  Sunday SpillersLogan Secor [place], [place] [phone]

## 2016-12-22 NOTE — Evaluation (Signed)
Occupational Therapy Evaluation Patient Details Name: Edward Arellano MRN: [id number] DOB: 09/04/1951 Today's Date: 12/22/2016    History of Present Illness 66 y.o. admitted for chest pain, slurred speech and syncopal episode at home. PMH includes CAD s/p CABG, afib, ED, HTN, OSA on CPAP, prior stroke, T2DM, HLD, GERD, GAD/somatiform disorder, BPPV, chronic migraine, prior TIA, peripheral neuropathy, pacemaker.   Clinical Impression   Pt independent with ADLs, PTA. Feel pt will benefit from acute OT to increase independence and activity tolerance prior to d/c. Wife will be available for assistance at home upon d/c.    Follow Up Recommendations  No OT follow up;Supervision/Assistance - 24 hour    Equipment Recommendations       Recommendations for Other Services       Precautions / Restrictions Precautions Precautions: Fall Restrictions Weight Bearing Restrictions: No      Mobility Bed Mobility Overal bed mobility: Needs Assistance Bed Mobility: Supine to Sit;Sit to Supine     Supine to sit: Supervision Sit to supine: Supervision      Transfers Overall transfer level: Needs assistance   Transfers: Sit to/from Stand Sit to Stand: Min guard              Balance  Pt slightly unsteady with ambulation.                                          ADL Overall ADL's : Needs assistance/impaired                     Lower Body Dressing: Min guard;Sit to/from stand   Toilet Transfer: Min guard;Ambulation (sit to stand from bed)           Functional mobility during ADLs: Min guard General ADL Comments: Recommended that pt use shower chair.      Vision     Perception     Praxis      Pertinent Vitals/Pain Pain Assessment: 0-10 Pain Score: 6  Pain Location: back and IV site in Rt arm Pain Descriptors / Indicators: Burning;Sore Pain Intervention(s): Monitored during session     Hand Dominance     Extremity/Trunk  Assessment Upper Extremity Assessment Upper Extremity Assessment: LUE deficits/detail;RUE deficits/detail (5/5 strength in bilateral shoulder flexors) RUE Coordination: decreased fine motor LUE Coordination: decreased fine motor   Lower Extremity Assessment Lower Extremity Assessment: Defer to PT evaluation (left leg felt slightly weaker)       Communication Communication Communication: No difficulties   Cognition Arousal/Alertness: Awake/alert Behavior During Therapy: WFL for tasks assessed/performed Overall Cognitive Status:  (seemed to have slow processing-unsure of baseline)                     General Comments       Exercises       Shoulder Instructions      Home Living Family/patient expects to be discharged to:: Private residence Living Arrangements: Spouse/significant other Available Help at Discharge: Family;Available 24 hours/day Type of Home: House Home Access: Stairs to enter Entergy CorporationEntrance Stairs-Number of Steps: 2 Entrance Stairs-Rails: None Home Layout: One level     Bathroom Shower/Tub: Tub/shower unit;Walk-in shower         Home Equipment: Bedside commode          Prior Functioning/Environment Level of Independence: Independent  OT Problem List: Decreased strength;Pain;Decreased range of motion;Decreased activity tolerance;Decreased coordination;Decreased knowledge of use of DME or AE;Decreased cognition;Impaired balance (sitting and/or standing)   OT Treatment/Interventions: Self-care/ADL training;DME and/or AE instruction;Energy conservation;Therapeutic activities;Balance training;Patient/family education;Cognitive remediation/compensation    OT Goals(Current goals can be found in the care plan section) Acute Rehab OT Goals Patient Stated Goal: not stated OT Goal Formulation: With patient Time For Goal Achievement: 12/29/16 Potential to Achieve Goals: Good ADL Goals Pt Will Perform Lower Body Bathing: with modified  independence;sit to/from stand (including gathering items) Pt Will Perform Lower Body Dressing: with modified independence;sit to/from stand (including gathering items) Pt Will Transfer to Toilet: with modified independence;ambulating Additional ADL Goal #1: Pt will independently perform bilatearl fine motor coordination activities to improve coordination.  OT Frequency: Min 2X/week   Barriers to D/C:            Co-evaluation              End of Session Equipment Utilized During Treatment: Gait belt  Activity Tolerance: Patient tolerated treatment well Patient left: in bed;with call bell/phone within reach;with family/visitor present   Time: 1610-9604 OT Time Calculation (min): 17 min Charges:  OT General Charges $OT Visit: 1 Procedure OT Evaluation $OT Eval Moderate Complexity: 1 Procedure G-Codes: OT G-codes **NOT FOR INPATIENT CLASS** Functional Assessment Tool Used: clinical judgment Functional Limitation: Self care Self Care Current Status (V4098): At least 1 percent but less than 20 percent impaired, limited or restricted Self Care Goal Status (J1914): 0 percent impaired, limited or restricted  Elya Diloreto L OTR/L 12/22/2016, 10:12 AM

## 2016-12-22 NOTE — Consult Note (Signed)
Neurology Consultation Reason for Consult: Slurred Speech Referring Physician: Pollie Meyer, B  CC: Slurred speech  History is obtained from: Patient, wife  HPI: Edward Arellano is a 66 y.o. male who awoke this morning and had severe chest pain with lightheadedness and slurred speech. He went into the kitchen, and then passed out.  He improved over the course of the day and has not been back to baseline for quite some time.  Of note, he has a history of syncope and would have episodes of confusion which essentially resolved following pacemaker placement.  LKW: 1/18 prior to bed tpa given?: no, outside of window, rapidly improving symptoms  ROS: A 14 point ROS was performed and is negative except as noted in the HPI.   Past Medical History:  Diagnosis Date  . Anxiety   . Arthritis   . Complication of anesthesia    Takes alot to sedate patient.  . Coronary artery disease   . Diabetes mellitus with neuropathy (HCC)    A1C 5.0  . GERD (gastroesophageal reflux disease)    corrected with Hital Hernia  . H/O hiatal hernia    corrected  . ZOXWRUEA(540.9)    sees a neuroologist- Dr. Celine Mans  . Hypertension   . Mental disorder    OCD.     [name] Paroxysmal a-fib (HCC)    Pacemaker in place  . Presence of permanent cardiac pacemaker   . Sleep apnea    USES CPAP  . TIA (transient ischemic attack)      Family History  Problem Relation Age of Onset  . Cancer Mother   . Prostate cancer Father   . Cancer Brother      Social History:  reports that he has never smoked. He has never used smokeless tobacco. He reports that he does not drink alcohol or use drugs.   Exam: Current vital signs: BP 119/83   Pulse 72   Temp 97.7 F (36.5 C)   Resp 19   Ht 6\' 2"  (1.88 m)   Wt 106.8 kg (235 lb 8 oz)   SpO2 96%   BMI 30.24 kg/m  Vital signs in last 24 hours: Temp:  [97.7 F (36.5 C)-98.1 F (36.7 C)] 97.7 F (36.5 C) (01/19 2100) Pulse Rate:  [61-77] 72 (01/19 2100) Resp:   [6-19] 19 (01/19 2100) BP: (105-156)/(77-100) 119/83 (01/19 2100) SpO2:  [89 %-99 %] 96 % (01/19 2100) Weight:  [106.8 kg (235 lb 8 oz)] 106.8 kg (235 lb 8 oz) (01/19 1543)   Physical Exam  Constitutional: Appears well-developed and well-nourished.  Psych: Affect appropriate to situation Eyes: No scleral injection HENT: No OP obstrucion Head: Normocephalic.  Cardiovascular: Normal rate and regular rhythm.  Respiratory: Effort normal and breath sounds normal to anterior ascultation GI: Soft.  No distension. There is no tenderness.  Skin: WDI  Neuro: Mental Status: Patient is awake, alert, oriented to person, place, month, year, and situation. Patient is able to give a clear and coherent history. No signs of aphasia or neglect Cranial Nerves: II: Visual Fields are full. Pupils are equal, round, and reactive to light.   III,IV, VI: EOMI without ptosis or diploplia.  V: Facial sensation is symmetric to temperature VII: Facial movement is symmetric. Initially, I had some question if he had some right-sided facial weakness, but after sitting up and repositioning it appears symmetric. VIII: hearing is intact to voice X: Uvula elevates symmetrically XI: Shoulder shrug is symmetric. XII: tongue is midline without atrophy or fasciculations.  Motor: Tone is normal. Bulk is normal. 5/5 strength was present in all four extremities.  Sensory: Sensation is symmetric to light touch and temperature in the arms and legs. Cerebellar: FNF and HKS are intact bilaterally   I have reviewed labs in epic and the results pertinent to this consultation are: Troponin-negative  I have reviewed the images obtained: CT head-negative  Impression: 66 year old male with syncope in the setting of severe chest pain. Slurred speech is not uncommon in episodes of cerebral hypoperfusion, and I have relatively low suspicion for ischemic infarct. It is reasonable to repeat his CT 48 hours following symptoms, but  if this is negative then I do not think I would further pursue this given that he is back to his baseline.  The chest pain would be very atypical for neurological causes of syncope(e.g. seizure).  Recommendations: 1) repeat head CT 1/21 2) given that he is back to his baseline, okay to use heparin from a neurological perspective. 3) agree with cardiology evaluation    Ritta SlotMcNeill Kirkpatrick, MD [facility] [phone]  If 7pm- 7am, please page neurology on call as listed in AMION.

## 2016-12-22 NOTE — Evaluation (Signed)
Speech Language Pathology Evaluation Patient Details Name: Edward Arellano MRN: [id number] DOB: May 14, 1951 Today's Date: 12/22/2016 Time: 1115-1140 SLP Time Calculation (min) (ACUTE ONLY): 25 min  Problem List:  Patient Active Problem List   Diagnosis Date Noted  . Chest pain 12/21/2016  . Syncope 12/21/2016   Past Medical History:  Past Medical History:  Diagnosis Date  . Anxiety   . Arthritis   . Complication of anesthesia    Takes alot to sedate patient.  . Coronary artery disease   . Diabetes mellitus with neuropathy (HCC)    A1C 5.0  . GERD (gastroesophageal reflux disease)    corrected with Hital Hernia  . H/O hiatal hernia    corrected  . [id number](478.2)    sees a neuroologist- Dr. Celine Mans  . Hypertension   . Mental disorder    OCD.     [name] Paroxysmal a-fib (HCC)    Pacemaker in place  . Presence of permanent cardiac pacemaker   . Sleep apnea    USES CPAP  . TIA (transient ischemic attack)    Past Surgical History:  Past Surgical History:  Procedure Laterality Date  . APPENDECTOMY    . Colonscopy    . CORONARY ARTERY BYPASS GRAFT  2001  . HERNIA REPAIR    . INGUINAL HERNIA REPAIR     Right and Left  . LUMBAR FUSION    . PERCUTANEOUS PINNING TOE FRACTURE     right 5th toe   HPI:  Edward Arellano is a 66 y.o. male presenting with chest pain and syncope. 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   Assessment / Plan / Recommendation Clinical Impression  Pt presented to hospital with slight facial droop. chest pain and syncope. CT negative for stroke but unable to have MRI secondary to Pacemaker. All of patient's initial symptoms have resolved. Neurologist feels with resolved symptoms pt does not have a hemmhorage, but  would like to repeat CT scan on 1/21 before discharge to check for ischemic stroke or TIA. Pt's oral motor function and sensitivity are WFL with no evidence of dysarthria. Pt's receptive and expressive language, executive language skills and memory are also [facility]. At this time no speech and language therapy are recommended.     SLP Assessment  Patient does not need any further Speech Lanaguage Pathology Services    Follow Up Recommendations    no therapy at this time   Frequency and Duration           SLP Evaluation Cognition  Overall Cognitive Status: Within Functional Limits for tasks assessed Arousal/Alertness: Awake/alert Orientation Level: Oriented X4 Attention: Focused Focused Attention: Appears intact Memory: Appears intact Awareness: Appears intact Problem Solving: Appears intact Safety/Judgment: Appears intact       Comprehension  Auditory Comprehension Overall Auditory Comprehension: Appears within functional limits for tasks assessed Yes/No Questions: Within Functional Limits Commands: Within Functional Limits Conversation: Complex Visual Recognition/Discrimination Discrimination: Within Function Limits Reading Comprehension Reading Status: Within funtional limits    Expression Expression Primary Mode of Expression: Verbal Verbal Expression Overall Verbal Expression: Appears within functional limits for tasks assessed Initiation: No impairment Level of Generative/Spontaneous Verbalization: Conversation Repetition: No impairment Naming: No impairment Pragmatics: No impairment Non-Verbal Means of Communication: Not applicable   Oral / Motor  Oral Motor/Sensory  Function Overall Oral Motor/Sensory Function: Within functional limits Motor Speech Overall Motor Speech: Appears within functional limits for tasks assessed Respiration: Within functional limits Phonation: Normal Resonance: Within functional limits Articulation: Within functional  limitis Intelligibility: Intelligible Motor Planning: Witnin functional limits Motor Speech Errors: Not applicable   GO                    Lindalou HoseSarah J. Kavari Parrillo, MA, CCC-SLP 12/22/2016 11:54 AM

## 2016-12-22 NOTE — Progress Notes (Signed)
Subjective: Interval History:  No chest pain today.  Cardiology felt non-cardiac chest pain.  Objective: Vital signs in last 24 hours: Temp:  [97.7 F (36.5 C)-98.1 F (36.7 C)] 98 F (36.7 C) (01/20 0500) Pulse Rate:  [69-84] 83 (01/20 0500) Resp:  [14-20] 20 (01/20 0500) BP: (105-155)/(77-88) 140/88 (01/20 0500) SpO2:  [92 %-96 %] 94 % (01/20 0500) Weight:  [106.8 kg (235 lb 8 oz)-107.7 kg (237 lb 8 oz)] 107.7 kg (237 lb 8 oz) (01/20 0500)  Intake/Output from previous day: 01/19 0701 - 01/20 0700 In: 2015.6 [P.O.:960; I.V.:1055.6] Out: 300 [Urine:300] Intake/Output this shift: Total I/O In: 120 [P.O.:120] Out: -  Nutritional status: Diet heart healthy/carb modified Room service appropriate? Yes; Fluid consistency: Thin  Neurologic Exam: Normal  Lab Results:  Recent Labs  12/21/16 1015 12/22/16 0600  WBC 4.7 5.6  HGB 15.9 14.6  HCT 46.2 43.1  PLT 128* 127*  NA 144  --   K 4.3  --   CL 108  --   CO2 28  --   GLUCOSE 97  --   BUN 8  --   CREATININE 0.90  --   CALCIUM 9.1  --    Lipid Panel  Recent Labs  12/22/16 0559  CHOL 149  TRIG 141  HDL 43  CHOLHDL 3.5  VLDL 28  LDLCALC 78    Studies/Results: Dg Chest 2 View  Result Date: 12/21/2016 CLINICAL DATA:  Left chest pain radiating into the left arm today. EXAM: CHEST  2 VIEW COMPARISON:  PA and lateral chest 12/04/2016 and 01/05/2016. FINDINGS: The patient is status post CABG with a pacing device in place. Heart size is upper normal. Lungs are clear. No pneumothorax or pleural fluid. No acute bony abnormality. IMPRESSION: No acute disease. Electronically Signed   By: Drusilla Kanner M.D.   On: 12/21/2016 10:52   Ct Head Wo Contrast  Result Date: 12/21/2016 CLINICAL DATA:  Syncope.  Neck pain EXAM: CT HEAD WITHOUT CONTRAST CT CERVICAL SPINE WITHOUT CONTRAST TECHNIQUE: Multidetector CT imaging of the head and cervical spine was performed following the standard protocol without intravenous contrast.  Multiplanar CT image reconstructions of the cervical spine were also generated. COMPARISON:  CT head 01/05/2016 FINDINGS: CT HEAD FINDINGS Brain: Moderate atrophy. Mild chronic microvascular ischemic change in the white matter. Negative for acute infarct. Negative for hemorrhage or mass. No shift of the midline structures. Vascular: Mild hyperdensity in the distal internal carotid artery right greater than left unchanged from the prior study most likely due to atherosclerotic disease. Negative for dense MCA. Skull: Negative Sinuses/Orbits: Negative Other: None CT CERVICAL SPINE FINDINGS Alignment: Normal Skull base and vertebrae: Negative for fracture. Soft tissues and spinal canal: Negative Disc levels: Mild disc and facet degeneration in the cervical spine. Upper chest: Negative Other: None IMPRESSION: Atrophy and chronic microvascular ischemia. No acute intracranial abnormality. Negative for cervical spine fracture. Electronically Signed   By: Marlan Palau M.D.   On: 12/21/2016 17:00   Ct Cervical Spine Wo Contrast  Result Date: 12/21/2016 CLINICAL DATA:  Syncope.  Neck pain EXAM: CT HEAD WITHOUT CONTRAST CT CERVICAL SPINE WITHOUT CONTRAST TECHNIQUE: Multidetector CT imaging of the head and cervical spine was performed following the standard protocol without intravenous contrast. Multiplanar CT image reconstructions of the cervical spine were also generated. COMPARISON:  CT head 01/05/2016 FINDINGS: CT HEAD FINDINGS Brain: Moderate atrophy. Mild chronic microvascular ischemic change in the white matter. Negative for acute infarct. Negative for hemorrhage or mass. No  shift of the midline structures. Vascular: Mild hyperdensity in the distal internal carotid artery right greater than left unchanged from the prior study most likely due to atherosclerotic disease. Negative for dense MCA. Skull: Negative Sinuses/Orbits: Negative Other: None CT CERVICAL SPINE FINDINGS Alignment: Normal Skull base and vertebrae:  Negative for fracture. Soft tissues and spinal canal: Negative Disc levels: Mild disc and facet degeneration in the cervical spine. Upper chest: Negative Other: None IMPRESSION: Atrophy and chronic microvascular ischemia. No acute intracranial abnormality. Negative for cervical spine fracture. Electronically Signed   By: Marlan Palauharles  Clark M.D.   On: 12/21/2016 17:00    Medications:  Scheduled: .  stroke: mapping our early stages of recovery book   Does not apply Once  . apixaban  5 mg Oral BID  . aspirin  81 mg Oral Once  . buPROPion  100 mg Oral Daily  . cholecalciferol  2,000 Units Oral Daily  . clonazePAM  1 mg Oral BID  . divalproex  750 mg Oral TID  . escitalopram  10 mg Oral q morning - 10a  . losartan  25 mg Oral Daily  . pantoprazole  80 mg Oral Q1200  . pregabalin  75 mg Oral BID  . rosuvastatin  10 mg Oral QPM    Assessment/Plan:  No evidence of neurological disease.  Mild vague slurred speech was non-specific associated with the chest pain.  No further neurological work up necessary.     LOS: 0 days   Weston SettleESHRAGHI, Daliah Chaudoin, MD 12/22/2016  12:17 PM

## 2016-12-22 NOTE — Progress Notes (Signed)
*  PRELIMINARY RESULTS* Vascular Ultrasound Carotid Duplex (Doppler) has been completed.  Preliminary findings: Bilateral 1-39% ICA stenosis, antegrade vertebral flow.   Edward FischerCharlotte C [name] 12/22/2016, 3:07 PM

## 2016-12-23 LAB — HEMOGLOBIN A1C
Hgb A1c MFr Bld: 6.1 % — ABNORMAL HIGH (ref 4.8–5.6)
Mean Plasma Glucose: 128 mg/dL

## 2016-12-24 SURGERY — LEFT HEART CATH AND CORS/GRAFTS ANGIOGRAPHY
Anesthesia: LOCAL

## 2018-01-11 IMAGING — DX DG CHEST 2V
3 series · 3 of 3 positions shown · non-contrast
Comparison: PA and lateral chest 12/04/2016 and 01/05/2016.

CLINICAL DATA: Left chest pain radiating into the left arm today.

EXAM:
CHEST  2 VIEW

[chest lat (1 of 2)]
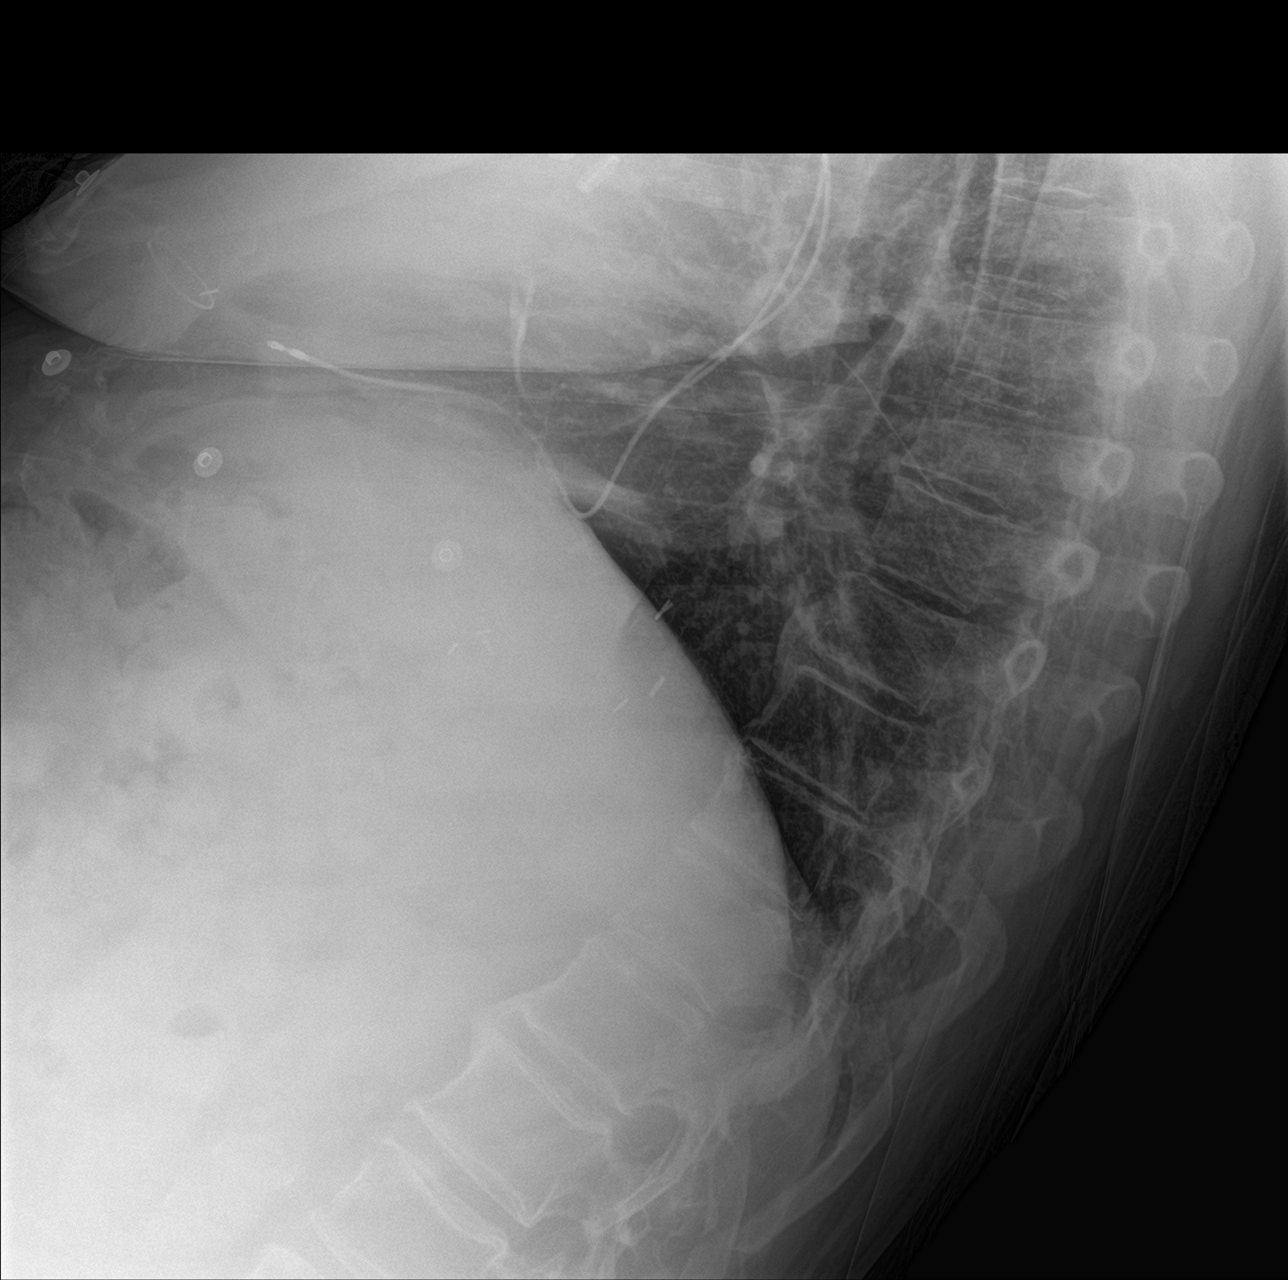

[chest ap]
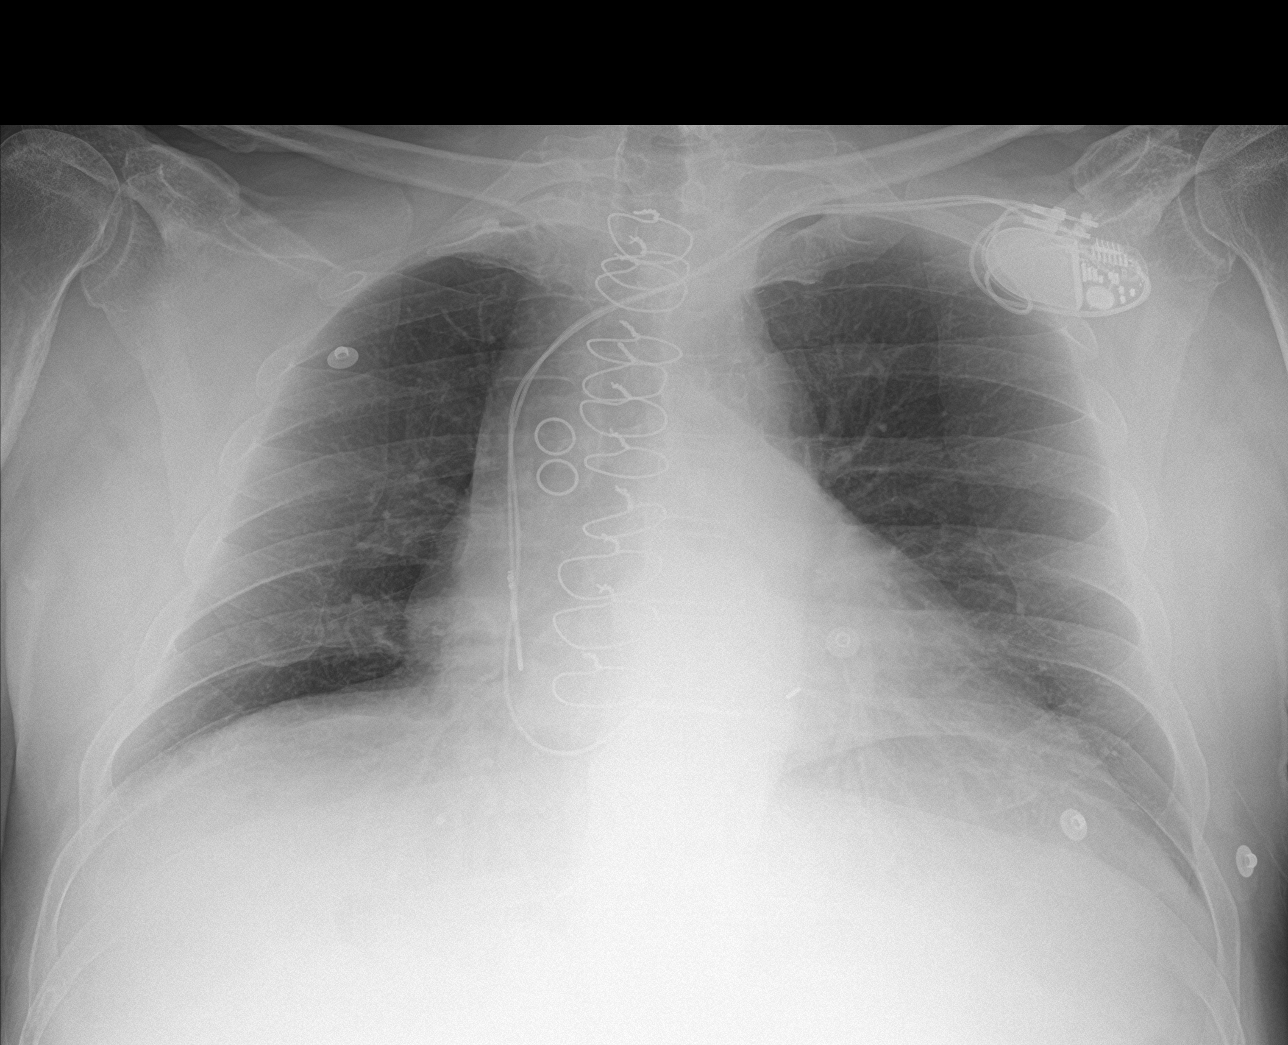

[chest lat (2 of 2)]
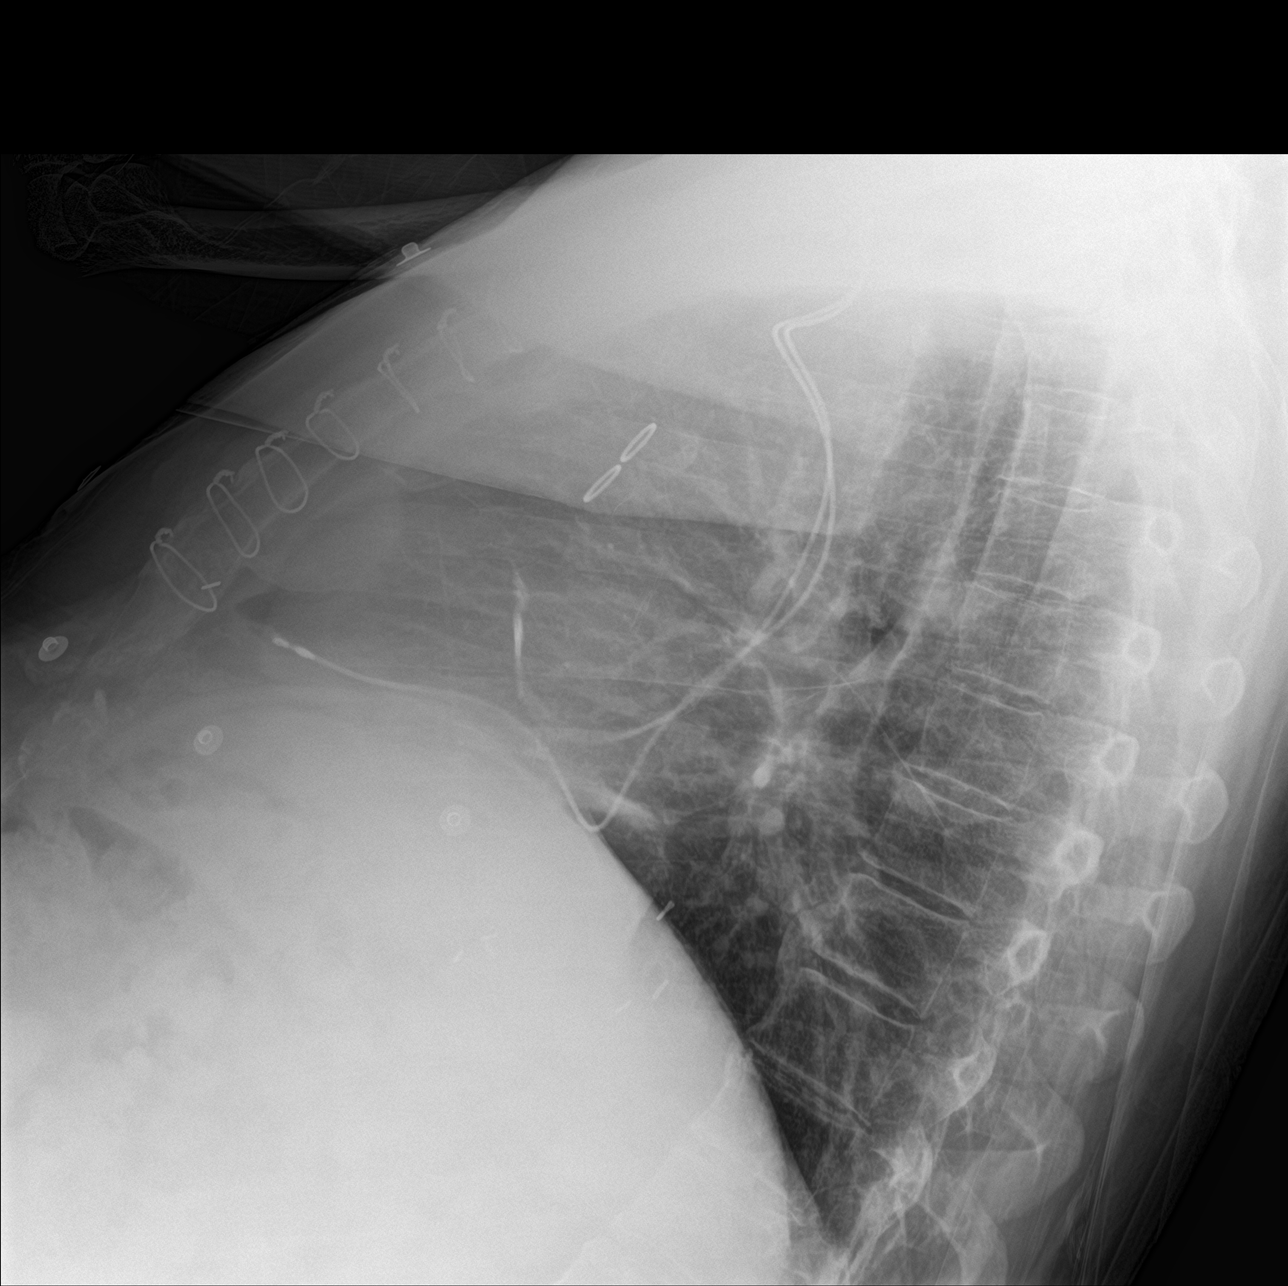

[3 of 3 positions shown; findings below may reference images not displayed]

FINDINGS: The patient is status post CABG with a pacing device in place. Heart
size is upper normal. Lungs are clear. No pneumothorax or pleural
fluid. No acute bony abnormality.
IMPRESSION: No acute disease.

## 2018-01-11 IMAGING — CT CT CERVICAL SPINE W/O CM
5 of 8 series · 15 of 33 positions shown, 16 images · non-contrast
Comparison: CT head 01/05/2016

CLINICAL DATA: Syncope.  Neck pain

EXAM:
CT HEAD WITHOUT CONTRAST
CT CERVICAL SPINE WITHOUT CONTRAST
TECHNIQUE: Multidetector CT imaging of the head and cervical spine was
performed following the standard protocol without intravenous
contrast. Multiplanar CT image reconstructions of the cervical spine
were also generated.

[Series 4: head bone · axial · 0.45mm/px · z∈[-329,-275]mm · 2 of 82 slices shown]
[im 28/82  bone]
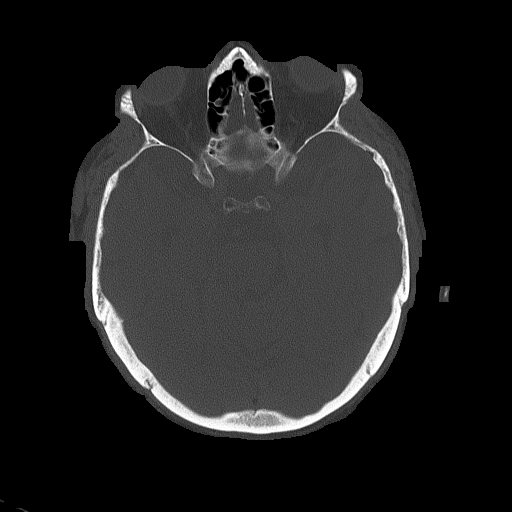
[im 55/82  bone]
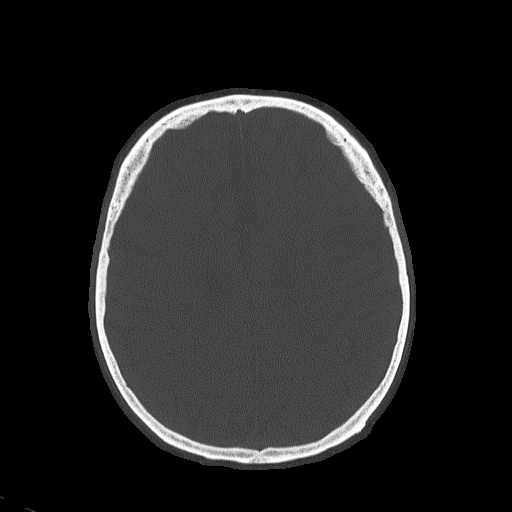

[Series 7: c_spine 2.0 st · axial · 0.35mm/px · z∈[-527,-419]mm · 3 of 109 slices shown, 4 images]
[im 28/109  soft-tissue]
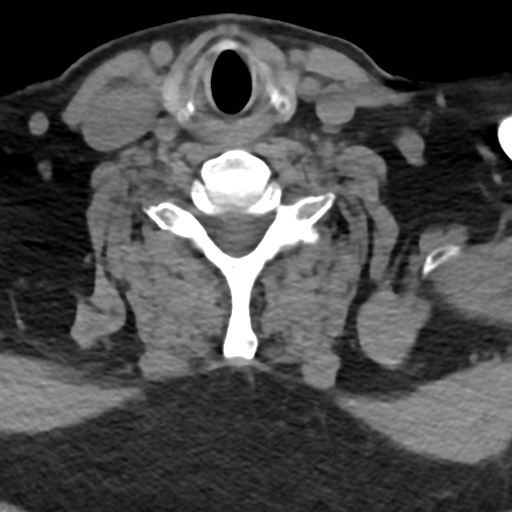
[im 28/109  bone]
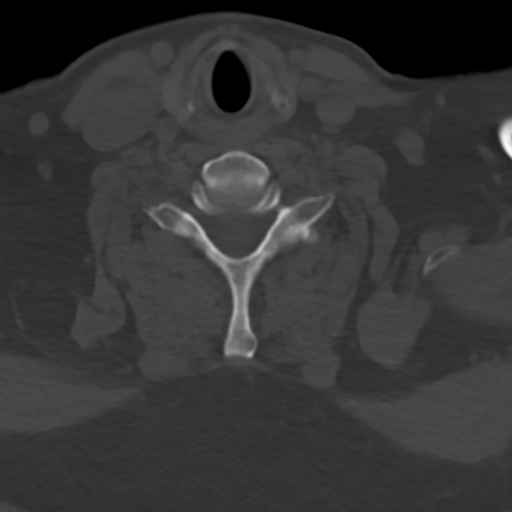
[im 55/109  bone]
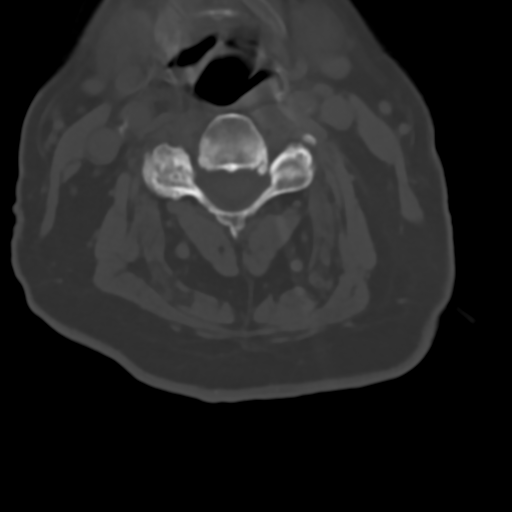
[im 82/109  bone]
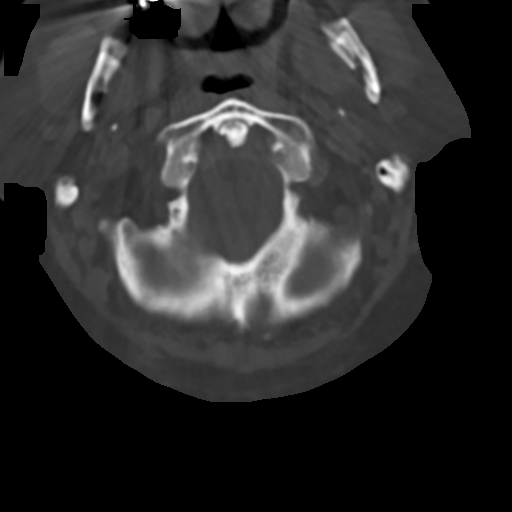

[Series 9: c_spine 2.0 sag bone · sagittal · 0.40mm/px · 5 of 61 slices shown]
[im 11/61  bone]
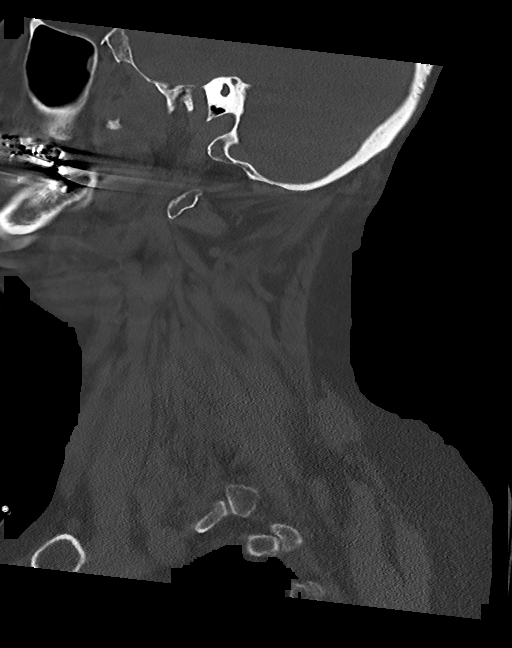
[im 21/61  bone]
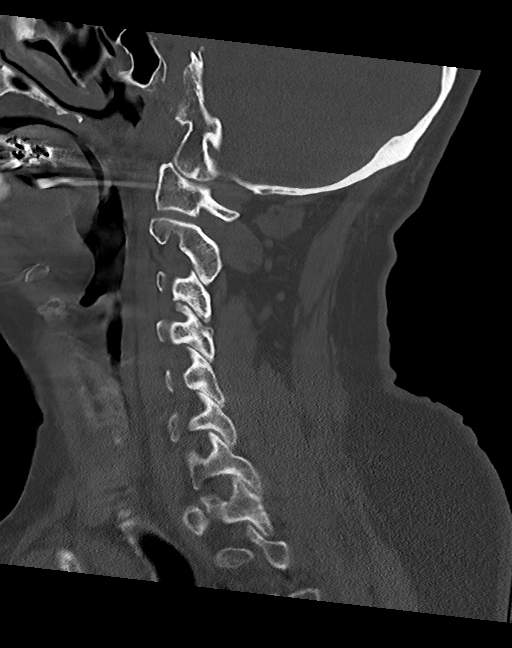
[im 31/61  bone]
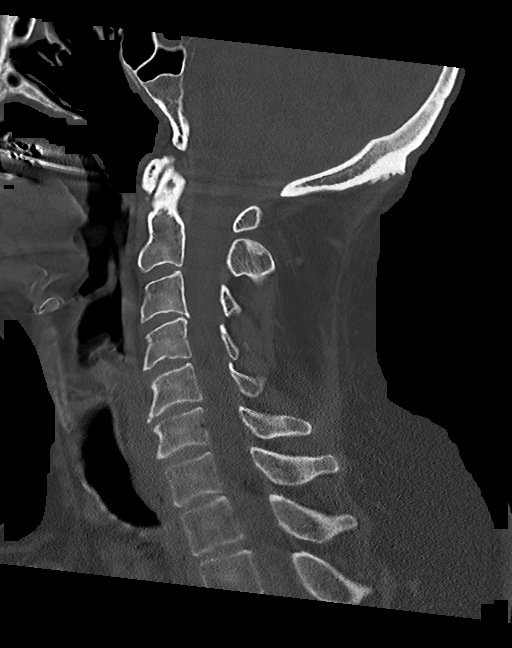
[im 41/61  bone]
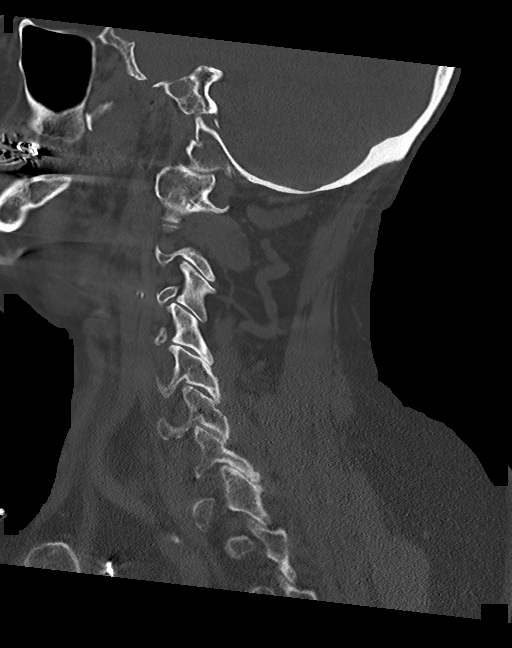
[im 51/61  bone]
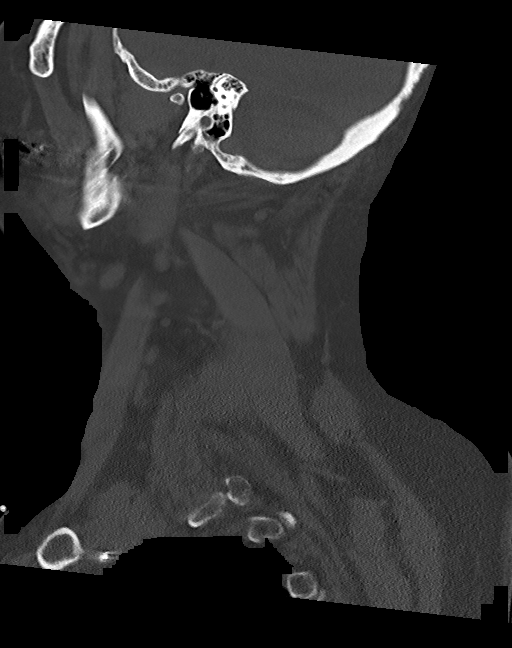

[Series 10: c_spine 2.0 cor bone · coronal · 0.39mm/px · 3 of 62 slices shown]
[im 16/62  bone]
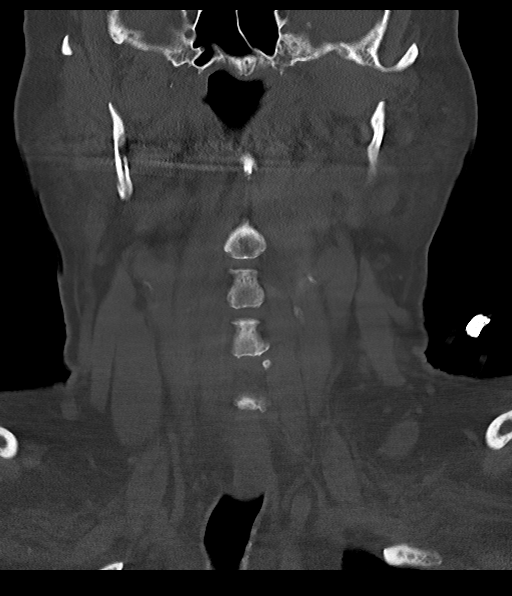
[im 31/62  bone]
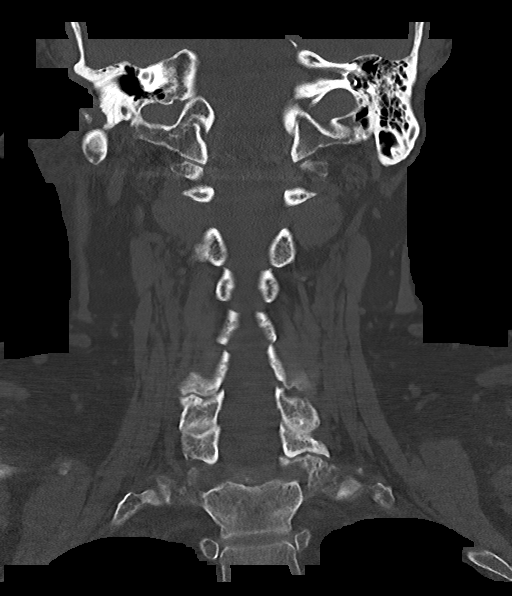
[im 46/62  bone]
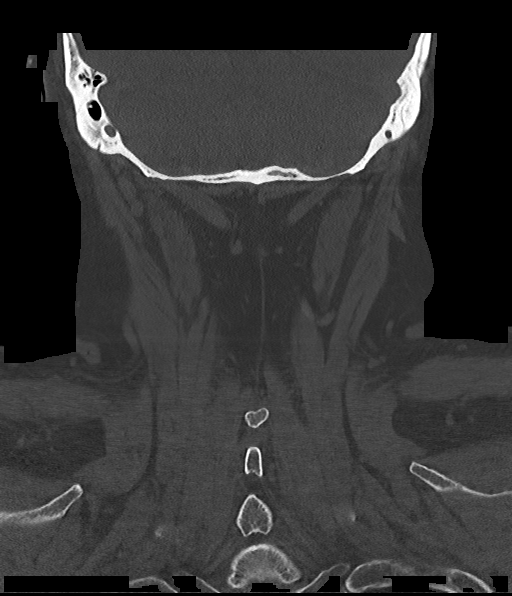

[Series 11: c_spine 2.0 orthogonals · axial · 0.21mm/px · z∈[-540,-476]mm · 2 of 95 slices shown]
[im 32/95  bone]
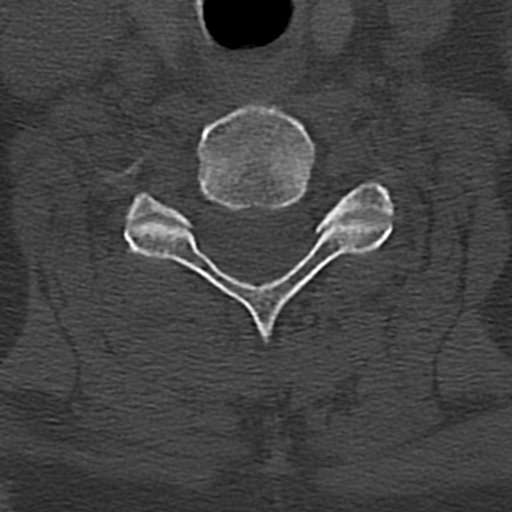
[im 63/95  bone]
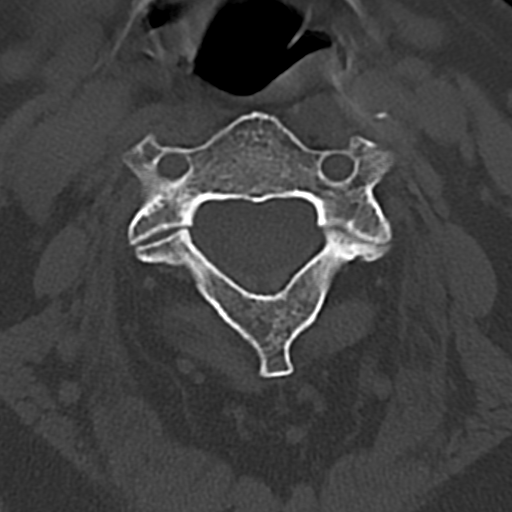

[15 of 33 positions shown; findings below may reference images not displayed]

FINDINGS: CT HEAD FINDINGS

Brain: Moderate atrophy. Mild chronic microvascular ischemic change
in the white matter. Negative for acute infarct. Negative for
hemorrhage or mass. No shift of the midline structures.

Vascular: Mild hyperdensity in the distal internal carotid artery
right greater than left unchanged from the prior study most likely
due to atherosclerotic disease. Negative for dense MCA.

Skull: Negative

Sinuses/Orbits: Negative

Other: None

CT CERVICAL SPINE FINDINGS

Alignment: Normal

Skull base and vertebrae: Negative for fracture.

Soft tissues and spinal canal: Negative

Disc levels: Mild disc and facet degeneration in the cervical spine.

Upper chest: Negative

Other: None
IMPRESSION: Atrophy and chronic microvascular ischemia. No acute intracranial
abnormality.

Negative for cervical spine fracture.

## 2021-06-13 ENCOUNTER — Inpatient Hospital Stay (HOSPITAL_COMMUNITY)
Admission: EM | Admit: 2021-06-13 | Discharge: 2021-06-17 | DRG: 101 | Disposition: A | Discharge status: Home / Self Care | Payer: Medicare Other | Attending: Internal Medicine | Admitting: Internal Medicine

## 2021-06-13 ENCOUNTER — Emergency Department (HOSPITAL_COMMUNITY): Payer: Medicare Other

## 2021-06-13 ENCOUNTER — Other Ambulatory Visit: Payer: Self-pay

## 2021-06-13 DIAGNOSIS — I10 Essential (primary) hypertension: Secondary | ICD-10-CM | POA: Diagnosis present

## 2021-06-13 DIAGNOSIS — G40909 Epilepsy, unspecified, not intractable, without status epilepticus: Principal | ICD-10-CM

## 2021-06-13 DIAGNOSIS — R253 Fasciculation: Secondary | ICD-10-CM | POA: Diagnosis present

## 2021-06-13 DIAGNOSIS — R29703 NIHSS score 3: Secondary | ICD-10-CM | POA: Diagnosis present

## 2021-06-13 DIAGNOSIS — I4819 Other persistent atrial fibrillation: Secondary | ICD-10-CM | POA: Diagnosis not present

## 2021-06-13 DIAGNOSIS — Z981 Arthrodesis status: Secondary | ICD-10-CM

## 2021-06-13 DIAGNOSIS — Z950 Presence of cardiac pacemaker: Secondary | ICD-10-CM | POA: Diagnosis present

## 2021-06-13 DIAGNOSIS — G6181 Chronic inflammatory demyelinating polyneuritis: Secondary | ICD-10-CM | POA: Diagnosis present

## 2021-06-13 DIAGNOSIS — I2510 Atherosclerotic heart disease of native coronary artery without angina pectoris: Secondary | ICD-10-CM | POA: Diagnosis present

## 2021-06-13 DIAGNOSIS — G8194 Hemiplegia, unspecified affecting left nondominant side: Secondary | ICD-10-CM | POA: Diagnosis present

## 2021-06-13 DIAGNOSIS — F419 Anxiety disorder, unspecified: Secondary | ICD-10-CM | POA: Diagnosis present

## 2021-06-13 DIAGNOSIS — Z20822 Contact with and (suspected) exposure to covid-19: Secondary | ICD-10-CM | POA: Diagnosis present

## 2021-06-13 DIAGNOSIS — Z8673 Personal history of transient ischemic attack (TIA), and cerebral infarction without residual deficits: Secondary | ICD-10-CM

## 2021-06-13 DIAGNOSIS — F32A Depression, unspecified: Secondary | ICD-10-CM | POA: Diagnosis present

## 2021-06-13 DIAGNOSIS — Z7982 Long term (current) use of aspirin: Secondary | ICD-10-CM

## 2021-06-13 DIAGNOSIS — F429 Obsessive-compulsive disorder, unspecified: Secondary | ICD-10-CM | POA: Diagnosis present

## 2021-06-13 DIAGNOSIS — Z6832 Body mass index (BMI) 32.0-32.9, adult: Secondary | ICD-10-CM

## 2021-06-13 DIAGNOSIS — E1140 Type 2 diabetes mellitus with diabetic neuropathy, unspecified: Secondary | ICD-10-CM | POA: Diagnosis present

## 2021-06-13 DIAGNOSIS — R441 Visual hallucinations: Secondary | ICD-10-CM | POA: Diagnosis present

## 2021-06-13 DIAGNOSIS — R2990 Unspecified symptoms and signs involving the nervous system: Secondary | ICD-10-CM | POA: Diagnosis not present

## 2021-06-13 DIAGNOSIS — R4702 Dysphasia: Secondary | ICD-10-CM | POA: Diagnosis present

## 2021-06-13 DIAGNOSIS — R471 Dysarthria and anarthria: Secondary | ICD-10-CM | POA: Diagnosis present

## 2021-06-13 DIAGNOSIS — Z888 Allergy status to other drugs, medicaments and biological substances status: Secondary | ICD-10-CM

## 2021-06-13 DIAGNOSIS — R1310 Dysphagia, unspecified: Secondary | ICD-10-CM | POA: Diagnosis present

## 2021-06-13 DIAGNOSIS — G4730 Sleep apnea, unspecified: Secondary | ICD-10-CM | POA: Diagnosis present

## 2021-06-13 DIAGNOSIS — Z7901 Long term (current) use of anticoagulants: Secondary | ICD-10-CM

## 2021-06-13 DIAGNOSIS — Z79899 Other long term (current) drug therapy: Secondary | ICD-10-CM

## 2021-06-13 DIAGNOSIS — Z951 Presence of aortocoronary bypass graft: Secondary | ICD-10-CM

## 2021-06-13 DIAGNOSIS — E669 Obesity, unspecified: Secondary | ICD-10-CM | POA: Diagnosis present

## 2021-06-13 DIAGNOSIS — K219 Gastro-esophageal reflux disease without esophagitis: Secondary | ICD-10-CM | POA: Diagnosis present

## 2021-06-13 HISTORY — DX: Epilepsy, unspecified, not intractable, without status epilepticus: G40.909

## 2021-06-13 HISTORY — DX: Chronic inflammatory demyelinating polyneuritis: G61.81

## 2021-06-13 HISTORY — DX: Cerebral infarction, unspecified: I63.9

## 2021-06-13 LAB — COMPREHENSIVE METABOLIC PANEL
ALT: 23 U/L (ref 0–44)
AST: 33 U/L (ref 15–41)
Albumin: 3.6 g/dL (ref 3.5–5.0)
Alkaline Phosphatase: 95 U/L (ref 38–126)
Anion gap: 8 (ref 5–15)
BUN: 10 mg/dL (ref 8–23)
CO2: 25 mmol/L (ref 22–32)
Calcium: 9 mg/dL (ref 8.9–10.3)
Chloride: 104 mmol/L (ref 98–111)
Creatinine, Ser: 1.03 mg/dL (ref 0.61–1.24)
GFR, Estimated: >60 mL/min (ref >60)
Glucose, Bld: 106 mg/dL — ABNORMAL HIGH (ref 70–99)
Potassium: 4.3 mmol/L (ref 3.5–5.1)
Sodium: 137 mmol/L (ref 135–145)
Total Bilirubin: 0.5 mg/dL (ref 0.3–1.2)
Total Protein: 6.5 g/dL (ref 6.5–8.1)

## 2021-06-13 LAB — ETHANOL: Alcohol, Ethyl (B): <10 mg/dL (ref <10)

## 2021-06-13 LAB — DIFFERENTIAL
Abs Immature Granulocytes: 0.02 10*3/uL (ref 0.00–0.07)
Basophils Absolute: 0 10*3/uL (ref 0.0–0.1)
Basophils Relative: 1 %
Eosinophils Absolute: 0.3 10*3/uL (ref 0.0–0.5)
Eosinophils Relative: 3 %
Immature Granulocytes: 0 %
Lymphocytes Relative: 25 %
Lymphs Abs: 1.9 10*3/uL (ref 0.7–4.0)
Monocytes Absolute: 0.9 10*3/uL (ref 0.1–1.0)
Monocytes Relative: 12 %
Neutro Abs: 4.7 10*3/uL (ref 1.7–7.7)
Neutrophils Relative %: 59 %

## 2021-06-13 LAB — I-STAT CHEM 8, ED
BUN: 13 mg/dL (ref 8–23)
Calcium, Ion: 1.09 mmol/L — ABNORMAL LOW (ref 1.15–1.40)
Chloride: 103 mmol/L (ref 98–111)
Creatinine, Ser: 1 mg/dL (ref 0.61–1.24)
Glucose, Bld: 106 mg/dL — ABNORMAL HIGH (ref 70–99)
HCT: 50 % (ref 39.0–52.0)
Hemoglobin: 17 g/dL (ref 13.0–17.0)
Potassium: 4.2 mmol/L (ref 3.5–5.1)
Sodium: 139 mmol/L (ref 135–145)
TCO2: 27 mmol/L (ref 22–32)

## 2021-06-13 LAB — APTT: aPTT: 31 seconds (ref 24–36)

## 2021-06-13 LAB — CBC
HCT: 50.4 % (ref 39.0–52.0)
Hemoglobin: 16.8 g/dL (ref 13.0–17.0)
MCH: 32.1 pg (ref 26.0–34.0)
MCHC: 33.3 g/dL (ref 30.0–36.0)
MCV: 96.2 fL (ref 80.0–100.0)
Platelets: 204 10*3/uL (ref 150–400)
RBC: 5.24 MIL/uL (ref 4.22–5.81)
RDW: 13.3 % (ref 11.5–15.5)
WBC: 7.9 10*3/uL (ref 4.0–10.5)
nRBC: 0 % (ref 0.0–0.2)

## 2021-06-13 LAB — CBG MONITORING, ED: Glucose-Capillary: 114 mg/dL — ABNORMAL HIGH (ref 70–99)

## 2021-06-13 LAB — PROTIME-INR
INR: 1.7 — ABNORMAL HIGH (ref 0.8–1.2)
Prothrombin Time: 19.6 seconds — ABNORMAL HIGH (ref 11.4–15.2)

## 2021-06-13 MED ORDER — IOHEXOL 350 MG/ML SOLN
100.0000 mL | Freq: Once | INTRAVENOUS | Status: AC | PRN
  Administered 2021-06-13: 100 mL via INTRAVENOUS

## 2021-06-13 NOTE — ED Provider Notes (Signed)
Care assumed from Dr. Stevie Kern, patient presented as code stroke following episode of chest pain, slurred speech, left-sided weakness.  Slurred speech and weakness had resolved on arrival.  If work-up is negative, will need to be admitted for TIA evaluation.  CT of head, CT angiogram of head and neck are all unremarkable.  Labs are unremarkable.  Case is discussed with Dr. Julian Reil of [facility], who agrees to admit the patient.  Results for orders placed or performed during the hospital encounter of 06/13/21  Resp Panel by RT-PCR (Flu A&B, Covid) Nasopharyngeal Swab   Specimen: Nasopharyngeal Swab; Nasopharyngeal(NP) swabs in vial transport medium  Result Value Ref Range   SARS Coronavirus 2 by RT PCR NEGATIVE NEGATIVE   Influenza A by PCR NEGATIVE NEGATIVE   Influenza B by PCR NEGATIVE NEGATIVE  Ethanol  Result Value Ref Range   Alcohol, Ethyl (B) <10 <10 mg/dL  Protime-INR  Result Value Ref Range   Prothrombin Time 19.6 (H) 11.4 - 15.2 seconds   INR 1.7 (H) 0.8 - 1.2  APTT  Result Value Ref Range   aPTT 31 24 - 36 seconds  CBC  Result Value Ref Range   WBC 7.9 4.0 - 10.5 K/uL   RBC 5.24 4.22 - 5.81 MIL/uL   Hemoglobin 16.8 13.0 - 17.0 g/dL   HCT 87.6 81.1 - 57.2 %   MCV 96.2 80.0 - 100.0 fL   MCH 32.1 26.0 - 34.0 pg   MCHC 33.3 30.0 - 36.0 g/dL   RDW 62.0 35.5 - 97.4 %   Platelets 204 150 - 400 K/uL   nRBC 0.0 0.0 - 0.2 %  Differential  Result Value Ref Range   Neutrophils Relative % 59 %   Neutro Abs 4.7 1.7 - 7.7 K/uL   Lymphocytes Relative 25 %   Lymphs Abs 1.9 0.7 - 4.0 K/uL   Monocytes Relative 12 %   Monocytes Absolute 0.9 0.1 - 1.0 K/uL   Eosinophils Relative 3 %   Eosinophils Absolute 0.3 0.0 - 0.5 K/uL   Basophils Relative 1 %   Basophils Absolute 0.0 0.0 - 0.1 K/uL   Immature Granulocytes 0 %   Abs Immature Granulocytes 0.02 0.00 - 0.07 K/uL  Comprehensive metabolic panel  Result Value Ref Range   Sodium 137 135 - 145 mmol/L   Potassium 4.3 3.5 -  5.1 mmol/L   Chloride 104 98 - 111 mmol/L   CO2 25 22 - 32 mmol/L   Glucose, Bld 106 (H) 70 - 99 mg/dL   BUN 10 8 - 23 mg/dL   Creatinine, Ser 1.63 0.61 - 1.24 mg/dL   Calcium 9.0 8.9 - 84.5 mg/dL   Total Protein 6.5 6.5 - 8.1 g/dL   Albumin 3.6 3.5 - 5.0 g/dL   AST 33 15 - 41 U/L   ALT 23 0 - 44 U/L   Alkaline Phosphatase 95 38 - 126 U/L   Total Bilirubin 0.5 0.3 - 1.2 mg/dL   GFR, Estimated >36 >46 mL/min   Anion gap 8 5 - 15  Urine rapid drug screen (hosp performed)  Result Value Ref Range   Opiates NONE DETECTED NONE DETECTED   Cocaine NONE DETECTED NONE DETECTED   Benzodiazepines NONE DETECTED NONE DETECTED   Amphetamines NONE DETECTED NONE DETECTED   Tetrahydrocannabinol NONE DETECTED NONE DETECTED   Barbiturates NONE DETECTED NONE DETECTED  I-stat chem 8, ED  Result Value Ref Range   Sodium 139 135 - 145 mmol/L   Potassium 4.2 3.5 -  5.1 mmol/L   Chloride 103 98 - 111 mmol/L   BUN 13 8 - 23 mg/dL   Creatinine, Ser 7.42 0.61 - 1.24 mg/dL   Glucose, Bld 595 (H) 70 - 99 mg/dL   Calcium, Ion 6.38 (L) 1.15 - 1.40 mmol/L   TCO2 27 22 - 32 mmol/L   Hemoglobin 17.0 13.0 - 17.0 g/dL   HCT 75.6 43.3 - 29.5 %  CBG monitoring, ED  Result Value Ref Range   Glucose-Capillary 114 (H) 70 - 99 mg/dL   DG Chest Portable 1 View  Result Date: 06/13/2021 CLINICAL DATA:  70 year old male with chest pain. EXAM: PORTABLE CHEST 1 VIEW COMPARISON:  Chest radiograph dated 06/13/2021. FINDINGS: There is cardiomegaly with mild vascular congestion. Minimal bibasilar atelectasis. No focal consolidation, pleural effusion, or pneumothorax. Median sternotomy wires. Left pectoral pacemaker device. No acute osseous pathology. IMPRESSION: Cardiomegaly with mild vascular congestion.  No focal consolidation. Electronically Signed   By: Elgie Collard M.D.   On: 06/13/2021 23:43   CT HEAD CODE STROKE WO CONTRAST  Result Date: 06/13/2021 CLINICAL DATA:  Code stroke.  Slurred speech with left arm weakness  EXAM: CT HEAD WITHOUT CONTRAST TECHNIQUE: Contiguous axial images were obtained from the base of the skull through the vertex without intravenous contrast. COMPARISON:  03/08/2021 FINDINGS: Brain: There is no mass, hemorrhage or extra-axial collection. The size and configuration of the ventricles and extra-axial CSF spaces are normal. There is hypoattenuation of the periventricular white matter, most commonly indicating chronic ischemic microangiopathy. Vascular: No abnormal hyperdensity of the major intracranial arteries or dural venous sinuses. No intracranial atherosclerosis. Skull: The visualized skull base, calvarium and extracranial soft tissues are normal. Sinuses/Orbits: No fluid levels or advanced mucosal thickening of the visualized paranasal sinuses. No mastoid or middle ear effusion. The orbits are normal. ASPECTS [facility] Stroke Program Early CT Score) - Ganglionic level infarction (caudate, lentiform nuclei, internal capsule, insula, M1-M3 cortex): 7 - Supraganglionic infarction (M4-M6 cortex): 3 Total score (0-10 with 10 being normal): 10 IMPRESSION: 1. Chronic ischemic microangiopathy without acute intracranial abnormality. 2. ASPECTS is 10. These results were communicated to Dr. Caryl Pina at 11:11 pm on 06/13/2021 by text page via the [facility] messaging system. Electronically Signed   By: Deatra Robinson M.D.   On: 06/13/2021 23:11   CT ANGIO HEAD NECK W WO CM W PERF (CODE STROKE)  Result Date: 06/13/2021 CLINICAL DATA:  Slurred speech with left-sided weakness EXAM: CT ANGIOGRAPHY HEAD AND NECK CT PERFUSION BRAIN TECHNIQUE: Multidetector CT imaging of the head and neck was performed using the standard protocol during bolus administration of intravenous contrast. Multiplanar CT image reconstructions and MIPs were obtained to evaluate the vascular anatomy. Carotid stenosis measurements (when applicable) are obtained utilizing NASCET criteria, using the distal internal carotid diameter as the  denominator. Multiphase CT imaging of the brain was performed following IV bolus contrast injection. Subsequent parametric perfusion maps were calculated using RAPID software. CONTRAST:  OMNIPAQUE IOHEXOL 350 MG/ML SOLN COMPARISON:  None. FINDINGS: CTA NECK FINDINGS SKELETON: There is no bony spinal canal stenosis. No lytic or blastic lesion. OTHER NECK: Normal pharynx, larynx and major salivary glands. No cervical lymphadenopathy. Unremarkable thyroid gland. UPPER CHEST: No pneumothorax or pleural effusion. No nodules or masses. AORTIC ARCH: There is no calcific atherosclerosis of the aortic arch. There is no aneurysm, dissection or hemodynamically significant stenosis of the visualized portion of the aorta. Conventional 3 vessel aortic branching pattern. The visualized proximal subclavian arteries are widely patent. RIGHT CAROTID  SYSTEM: Normal without aneurysm, dissection or stenosis. LEFT CAROTID SYSTEM: Normal without aneurysm, dissection or stenosis. VERTEBRAL ARTERIES: Left dominant configuration. Both origins are clearly patent. There is no dissection, occlusion or flow-limiting stenosis to the skull base (V1-V3 segments). CTA HEAD FINDINGS POSTERIOR CIRCULATION: --Vertebral arteries: Normal V4 segments. --Inferior cerebellar arteries: Normal. --Basilar artery: Normal. --Superior cerebellar arteries: Normal. --Posterior cerebral arteries (PCA): Normal. ANTERIOR CIRCULATION: --Intracranial internal carotid arteries: Normal. --Anterior cerebral arteries (ACA): Normal. Both A1 segments are present. Patent anterior communicating artery (a-comm). --Middle cerebral arteries (MCA): Normal. VENOUS SINUSES: As permitted by contrast timing, patent. ANATOMIC VARIANTS: None Review of the MIP images confirms the above findings. CT Brain Perfusion Findings: ASPECTS: 10 CBF (<30%) Volume: 46mL Perfusion (Tmax>6.0s) volume: 7mL Mismatch Volume: 88mL Infarction Location:None IMPRESSION: 1. Normal CTA of the head and  neck. 2. Normal CT perfusion. Electronically Signed   By: Deatra Robinson M.D.   On: 06/13/2021 23:29      Dione Booze, MD 06/14/21 [phone]

## 2021-06-13 NOTE — ED Provider Notes (Signed)
[facility] EMERGENCY DEPARTMENT Provider Note   CSN: [id number] Arrival date & time: 06/13/21  2243     History No chief complaint on file.   Edward Arellano is a 70 y.o. male.  History limited due to acuity.  Last known well 9 PM.  History obtained from EMS.  Patient had endorsed having chest pain, feeling somewhat short of breath and then had slurred speech and left-sided weakness.  Patient denies any acute complaints when questioned directly at time of arrival.  HPI     Past Medical History:  Diagnosis Date   Anxiety    Arthritis    Complication of anesthesia    Takes alot to sedate patient.   Coronary artery disease    Diabetes mellitus with neuropathy (HCC)    A1C 5.0   GERD (gastroesophageal reflux disease)    corrected with Hital Hernia   H/O hiatal hernia    corrected   Headache(784.0)    sees a neuroologist- Dr. Celine Mans   Hypertension    Mental disorder    OCD.      Paroxysmal a-fib (HCC)    Pacemaker in place   Presence of permanent cardiac pacemaker    Sleep apnea    USES CPAP   TIA (transient ischemic attack)     Patient Active Problem List   Diagnosis Date Noted   Benign paroxysmal positional vertigo 12/22/2016   Persistent atrial fibrillation (HCC)    Chest pain 12/21/2016   Syncope 12/21/2016    Past Surgical History:  Procedure Laterality Date   APPENDECTOMY     Colonscopy     CORONARY ARTERY BYPASS GRAFT  2001   HERNIA REPAIR     INGUINAL HERNIA REPAIR     Right and Left   LUMBAR FUSION     PERCUTANEOUS PINNING TOE FRACTURE     right 5th toe       Family History  Problem Relation Age of Onset   Cancer Mother    Prostate cancer Father    Cancer Brother     Social History   Tobacco Use   Smoking status: Never   Smokeless tobacco: Never  Substance Use Topics   Alcohol use: No   Drug use: No    Home Medications Prior to Admission medications   Medication Sig Start Date End Date Taking?  Authorizing Provider  acetaminophen (TYLENOL) 650 MG CR tablet Take 650 mg by mouth 2 (two) times daily as needed. For pain    [provider]  apixaban (ELIQUIS) 5 MG TABS tablet Take 5 mg by mouth 2 (two) times daily.    [provider]  ARIPiprazole (ABILIFY) 5 MG tablet Take 5 mg by mouth at bedtime as needed (sleep).    [provider]  buPROPion (WELLBUTRIN SR) 100 MG 12 hr tablet Take 100 mg by mouth daily.    [provider]  cholecalciferol (VITAMIN D) 1000 units tablet Take 2,000 Units by mouth daily.    [provider]  clonazePAM (KLONOPIN) 1 MG tablet Take 1 mg by mouth 2 (two) times daily. Name brand only     [provider]  divalproex (DEPAKOTE) 500 MG DR tablet Take 750 mg by mouth 3 (three) times daily.     [provider]  escitalopram (LEXAPRO) 10 MG tablet Take 10 mg by mouth every morning.    [provider]  esomeprazole (NEXIUM) 40 MG capsule Take 40 mg by mouth daily at 12 noon.  [provider]  metoprolol succinate (TOPROL-XL) 50 MG 24 hr tablet Take 50 mg by mouth daily. Take with or immediately following a meal.    [provider]  Multiple Vitamin (MULTIVITAMIN WITH MINERALS) TABS Take 1 tablet by mouth every morning.    [provider]  niacin 500 MG CR capsule Take 1,500 mg by mouth at bedtime.    [provider]  pregabalin (LYRICA) 75 MG capsule Take 75 mg by mouth 2 (two) times daily.    [provider]  rosuvastatin (CRESTOR) 10 MG tablet Take 10 mg by mouth every evening.    [provider]    Allergies    Nitroglycerin, Cyclobenzaprine, Niacin, and Tizanidine  Review of Systems   Review of Systems  Unable to perform ROS: Acuity of condition   Physical Exam Updated Vital Signs BP (!) 150/90 (BP Location: Left Arm)   Pulse 66   Resp 18   Wt 114.4 kg   SpO2 97%   BMI 32.38 kg/m   Physical Exam Vitals and nursing note  reviewed.  Constitutional:      Appearance: He is well-developed.  HENT:     Head: Normocephalic and atraumatic.  Eyes:     Conjunctiva/sclera: Conjunctivae normal.  Cardiovascular:     Rate and Rhythm: Normal rate and regular rhythm.     Heart sounds: No murmur heard. Pulmonary:     Effort: Pulmonary effort is normal. No respiratory distress.     Breath sounds: Normal breath sounds.  Abdominal:     Palpations: Abdomen is soft.     Tenderness: There is no abdominal tenderness.  Musculoskeletal:     Cervical back: Neck supple.  Skin:    General: Skin is warm and dry.  Neurological:     Mental Status: He is alert.     Comments: Alert, slightly lethargic/confused but answers basic questions appropriately, strength and sensation grossly intact in all 4 extremities, speech is clear, no facial droop    ED Results / Procedures / Treatments   Labs (all labs ordered are listed, but only abnormal results are displayed) Labs Reviewed  PROTIME-INR - Abnormal; Notable for the following components:      Result Value   Prothrombin Time 19.6 (*)    INR 1.7 (*)    All other components within normal limits  I-STAT CHEM 8, ED - Abnormal; Notable for the following components:   Glucose, Bld 106 (*)    Calcium, Ion 1.09 (*)    All other components within normal limits  CBG MONITORING, ED - Abnormal; Notable for the following components:   Glucose-Capillary 114 (*)    All other components within normal limits  RESP PANEL BY RT-PCR (FLU A&B, COVID) ARPGX2  APTT  CBC  DIFFERENTIAL  ETHANOL  COMPREHENSIVE METABOLIC PANEL  RAPID URINE DRUG SCREEN, HOSP PERFORMED  URINALYSIS, ROUTINE W REFLEX MICROSCOPIC  BRAIN NATRIURETIC PEPTIDE  TROPONIN I (HIGH SENSITIVITY)    EKG None  Radiology CT HEAD CODE STROKE WO CONTRAST  Result Date: 06/13/2021 CLINICAL DATA:  Code stroke.  Slurred speech with left arm weakness EXAM: CT HEAD WITHOUT CONTRAST TECHNIQUE: Contiguous axial images were obtained  from the base of the skull through the vertex without intravenous contrast. COMPARISON:  03/08/2021 FINDINGS: Brain: There is no mass, hemorrhage or extra-axial collection. The size and configuration of the ventricles and extra-axial CSF spaces are normal. There is hypoattenuation of the periventricular white matter, most commonly indicating chronic ischemic microangiopathy. Vascular: No abnormal  hyperdensity of the major intracranial arteries or dural venous sinuses. No intracranial atherosclerosis. Skull: The visualized skull base, calvarium and extracranial soft tissues are normal. Sinuses/Orbits: No fluid levels or advanced mucosal thickening of the visualized paranasal sinuses. No mastoid or middle ear effusion. The orbits are normal. ASPECTS [facility] Stroke Program Early CT Score) - Ganglionic level infarction (caudate, lentiform nuclei, internal capsule, insula, M1-M3 cortex): 7 - Supraganglionic infarction (M4-M6 cortex): 3 Total score (0-10 with 10 being normal): 10 IMPRESSION: 1. Chronic ischemic microangiopathy without acute intracranial abnormality. 2. ASPECTS is 10. These results were communicated to Dr. Caryl Pina at 11:11 pm on 06/13/2021 by text page via the [facility] messaging system. Electronically Signed   By: Deatra Robinson M.D.   On: 06/13/2021 23:11    Procedures Procedures   Medications Ordered in ED Medications  iohexol (OMNIPAQUE) 350 MG/ML injection 100 mL (100 mLs Intravenous Contrast Given 06/13/21 2314)    ED Course  I have reviewed the triage vital signs and the nursing notes.  Pertinent labs & imaging results that were available during my care of the patient were reviewed by me and considered in my medical decision making (see chart for details).    MDM Rules/Calculators/A&P                          70 year old male presented to ER as a stroke alert.  Reported slurred speech and left-sided weakness.  On arrival to ER did not appreciate significant weakness.  Not clearly  stroke.  Taken to CT with neuro.  Given the report of episode of chest pain and shortness of breath, will also check EKG, troponin, chest x-ray.  While awaiting further recommendations from neurology, further work-up in ER, signed out to Dr. Preston Fleeting.  Final Clinical Impression(s) / ED Diagnoses Final diagnoses:  Stroke-like symptoms    Rx / DC Orders ED Discharge Orders     None        Milagros Loll, MD 06/13/21 2324

## 2021-06-13 NOTE — ED Notes (Signed)
CBG of 114

## 2021-06-13 NOTE — ED Triage Notes (Signed)
Pt bib [facility] as code stroke from home. LKN 2100. Pt was outside working in the garden, came inside and was c/o cp, sob, wife noted pt to be diaphoretic. After sitting, he developed L side weakness, decreased L sensation, and slurred speech. Pt is on warfarin for afib, has a defibrillator. Was placed on doxycycline yesterday for a spider bite.

## 2021-06-13 NOTE — Consult Note (Addendum)
NEURO HOSPITALIST CONSULT NOTE   Requestig physician: Dr. Stevie Kern  Reason for Consult: Acute onset of speech deficit  History obtained from:  EMS and Chart     HPI:                                                                                                                                          Edward Arellano is an 70 y.o. male with a history of paroxysmal atrial fibrillation (on Coumadin), anxiety, seizure disorder (on Vimpat and Keppra, formerly on Depakote), arthritis, CAD (s/p CABG), DM, GERD, headaches, HTN, OCD, s/p pacemaker placement, TIA and OSA who presents to the ED from home via EMS as a Code Stroke for acute speech deficit. EMS was initially called to the patient's home for CP. On arrival, the patient initially communicated normally, followed by sudden onset of receptive and expressive dysphasia with difficulty following commands and with garbled, stuttering speech. No facial droop or limb weakness was noted. EMS called Code Stroke in the field. CBG was 114, BP 150/90, SPO2 97, HR 66, RR 18 per EMS.  The patient's pacemaker is not MRI compatible per MRI technician.    Past Medical History:  Diagnosis Date   Anxiety    Arthritis    Complication of anesthesia    Takes alot to sedate patient.   Coronary artery disease    Diabetes mellitus with neuropathy (HCC)    A1C 5.0   GERD (gastroesophageal reflux disease)    corrected with Hital Hernia   H/O hiatal hernia    corrected   Headache(784.0)    sees a neuroologist- Dr. Celine Mans   Hypertension    Mental disorder    OCD.      Paroxysmal a-fib (HCC)    Pacemaker in place   Presence of permanent cardiac pacemaker    Sleep apnea    USES CPAP   TIA (transient ischemic attack)     Past Surgical History:  Procedure Laterality Date   APPENDECTOMY     Colonscopy     CORONARY ARTERY BYPASS GRAFT  2001   HERNIA REPAIR     INGUINAL HERNIA REPAIR     Right and Left   LUMBAR FUSION      PERCUTANEOUS PINNING TOE FRACTURE     right 5th toe    Family History  Problem Relation Age of Onset   Cancer Mother    Prostate cancer Father    Cancer Brother            Social History:  reports that he has never smoked. He has never used smokeless tobacco. He reports that he does not drink alcohol and does not use drugs.  Allergies  Allergen Reactions   Nitroglycerin Palpitations   Cyclobenzaprine Other (See Comments)  Dry mouth   Niacin Other (See Comments)    flushing   Tizanidine Other (See Comments)    Dry mouth    MEDICATIONS:                                                                                                                      No current facility-administered medications on file prior to encounter.   Current Outpatient Medications on File Prior to Encounter  Medication Sig Dispense Refill   acetaminophen (TYLENOL) 325 MG tablet Take 650 mg by mouth in the morning and at bedtime.     aspirin 81 MG EC tablet Take 81 mg by mouth daily.     cholecalciferol (VITAMIN D) 1000 units tablet Take 1,000 Units by mouth daily.     clonazePAM (KLONOPIN) 1 MG tablet Take 1 mg by mouth 2 (two) times daily. Name brand only      doxycycline (VIBRAMYCIN) 100 MG capsule Take 100 mg by mouth See admin instructions. Bid x 10 days     escitalopram (LEXAPRO) 10 MG tablet Take 10 mg by mouth every morning.     esomeprazole (NEXIUM) 40 MG capsule Take 40 mg by mouth daily.     fluticasone (FLONASE) 50 MCG/ACT nasal spray Place 1 spray into both nostrils daily as needed for allergies.     levETIRAcetam (KEPPRA) 1000 MG tablet Take 1,000 mg by mouth 2 (two) times daily.     MAGNESIUM-OXIDE 400 (240 Mg) MG tablet Take 1 tablet by mouth daily.     Menthol, Topical Analgesic, (BIOFREEZE EX) Apply 1 application topically daily as needed (pain).     metoprolol succinate (TOPROL-XL) 50 MG 24 hr tablet Take 50 mg by mouth daily. Take with or immediately following a meal.      mirtazapine (REMERON) 15 MG tablet Take 7.5-15 mg by mouth at bedtime.     rosuvastatin (CRESTOR) 20 MG tablet Take 20 mg by mouth every evening.     triamcinolone ointment (KENALOG) 0.1 % Apply 1 application topically in the morning and at bedtime. Apply to spider bite     verapamil (CALAN) 40 MG tablet Take 40 mg by mouth in the morning and at bedtime.     VIMPAT 200 MG TABS tablet Take 200 mg by mouth 2 (two) times daily.     vitamin B-12 (CYANOCOBALAMIN) 1000 MCG tablet Take 1,000 mcg by mouth daily.     vitamin C (ASCORBIC ACID) 500 MG tablet Take 500 mg by mouth at bedtime.     warfarin (COUMADIN) 2.5 MG tablet Take 2.5 mg by mouth See admin instructions. 1&1/2 daily 2.5 mg Monday and wednesday     ROS:  The patient has no complaints in the context of his AMS.    Blood pressure (!) 150/90, pulse 66, resp. rate 18, weight 114.4 kg, SpO2 97 %.   General Examination:                                                                                                       Physical Exam  HEENT-  /AT    Lungs- Respirations unlabored Extremities- No edema  Neurological Examination Mental Status: Awake and alert with poor eye contact, poor attention and pressured speech. Oriented to the state, month, year and day of the week, but not the city or hospital. His speech is tangential which in the context of poor attention is with difficulty following commands, requiring frequent repetition. When attention is redirected, he will follow commands appropriately and answer questions fluently without dysarthria. Able to count fingers. Difficulty with naming fingers, but correctly identified his nose, his chin and his ear.  Cranial Nerves: II: Visual fields intact bilaterally. PERRL.   III,IV, VI: No ptosis. EOMI. No nystagmus. V,VII: Smile symmetric, facial temp sensation  equal bilaterally VIII: hearing intact to voice IX,X: No hypophonia XI: Head is midline XII: midline tongue extension Motor: Right : Upper extremity   5/5    Left:     Upper extremity   5/5  Lower extremity   4+/5    Lower extremity   5/5 In the context of the above, there is slow drift of RLE when elevated antigravity.  No drift of upper extremities.  Sensory: Temp and light touch intact throughout, bilaterally. No extinction to DSS.  Deep Tendon Reflexes: 1+ and symmetric throughout Plantars: Right: downgoing   Left: downgoing Cerebellar: No ataxia with FNF bilaterally. Right H-S mildly ataxic. Left H-S normal.  Gait: Deferred   Lab Results: Basic Metabolic Panel: Recent Labs  Lab 06/13/21 2249  NA 139  K 4.2  CL 103  GLUCOSE 106*  BUN 13  CREATININE 1.00    CBC: Recent Labs  Lab 06/13/21 2249  HGB 17.0  HCT 50.0    Cardiac Enzymes: No results for input(s): CKTOTAL, CKMB, CKMBINDEX, TROPONINI in the last 168 hours.  Lipid Panel: No results for input(s): CHOL, TRIG, HDL, CHOLHDL, VLDL, LDLCALC in the last 168 hours.  Imaging: No results found.   Assessment: 70 year old male presenting with acute onset of expressive and receptive dysphasia 1. Exam reveals perseverative, pressured speech, difficulty following some commands, poor attention, mild disorientation, poor eye contact, odd prosodic content to speech and mild naming deficit. No dysarthria, facial droop, eye deviation, visual field cut, lateralized sensory deficit or focal weakness is noted.  2. DDx for presentation includes psychiatric/behavioral etiology and acute encephalopathy (possible precipitants include subclinical seizure activity and metabolic encephalopathy). Stroke/TIA is possible but overall pattern of his deficits does not militate strongly in favor of this.  3. CT head: No acute abnormality.  4. On Coumadin for paroxysmal atrial fibrillation. INR 1.7.  5. CTA of head and neck:  Normal 6. CT  perfusion, brain: Normal 7. NIHSS: 3 8. Given  low likelihood of stroke, potential benefits of tPA are significantly outweighed by risks.   Recommendations: 1. Continue home doses of his Vimpat 200 mg BID and Keppra 1000 mg BID 2. EEG in AM. If negative, may benefit from 24 hour EEG monitoring.  3. Pacemaker is not MRI compatible 4. Frequent neuro checks 5. Will need outpatient Neurology follow up.   Electronically signed: Dr. Caryl PinaEric Hicks Feick 06/13/2021, 11:09 PM

## 2021-06-14 ENCOUNTER — Observation Stay (HOSPITAL_COMMUNITY): Payer: Medicare Other

## 2021-06-14 ENCOUNTER — Observation Stay (HOSPITAL_BASED_OUTPATIENT_CLINIC_OR_DEPARTMENT_OTHER): Payer: Medicare Other

## 2021-06-14 ENCOUNTER — Encounter (HOSPITAL_COMMUNITY): Payer: Self-pay | Admitting: Internal Medicine

## 2021-06-14 DIAGNOSIS — G459 Transient cerebral ischemic attack, unspecified: Secondary | ICD-10-CM

## 2021-06-14 DIAGNOSIS — G40909 Epilepsy, unspecified, not intractable, without status epilepticus: Secondary | ICD-10-CM | POA: Diagnosis not present

## 2021-06-14 DIAGNOSIS — R2990 Unspecified symptoms and signs involving the nervous system: Secondary | ICD-10-CM | POA: Diagnosis not present

## 2021-06-14 DIAGNOSIS — I4819 Other persistent atrial fibrillation: Secondary | ICD-10-CM | POA: Diagnosis not present

## 2021-06-14 DIAGNOSIS — Z950 Presence of cardiac pacemaker: Secondary | ICD-10-CM | POA: Diagnosis not present

## 2021-06-14 DIAGNOSIS — Z8673 Personal history of transient ischemic attack (TIA), and cerebral infarction without residual deficits: Secondary | ICD-10-CM

## 2021-06-14 LAB — COMPREHENSIVE METABOLIC PANEL
ALT: 23 U/L (ref 0–44)
AST: 28 U/L (ref 15–41)
Albumin: 3.1 g/dL — ABNORMAL LOW (ref 3.5–5.0)
Alkaline Phosphatase: 77 U/L (ref 38–126)
Anion gap: 7 (ref 5–15)
BUN: 10 mg/dL (ref 8–23)
CO2: 27 mmol/L (ref 22–32)
Calcium: 9 mg/dL (ref 8.9–10.3)
Chloride: 104 mmol/L (ref 98–111)
Creatinine, Ser: 0.92 mg/dL (ref 0.61–1.24)
GFR, Estimated: >60 mL/min (ref >60)
Glucose, Bld: 103 mg/dL — ABNORMAL HIGH (ref 70–99)
Potassium: 4 mmol/L (ref 3.5–5.1)
Sodium: 138 mmol/L (ref 135–145)
Total Bilirubin: 0.6 mg/dL (ref 0.3–1.2)
Total Protein: 5.9 g/dL — ABNORMAL LOW (ref 6.5–8.1)

## 2021-06-14 LAB — CBC WITH DIFFERENTIAL/PLATELET
Abs Immature Granulocytes: 0.02 10*3/uL (ref 0.00–0.07)
Basophils Absolute: 0 10*3/uL (ref 0.0–0.1)
Basophils Relative: 1 %
Eosinophils Absolute: 0.2 10*3/uL (ref 0.0–0.5)
Eosinophils Relative: 4 %
HCT: 49.6 % (ref 39.0–52.0)
Hemoglobin: 16.7 g/dL (ref 13.0–17.0)
Immature Granulocytes: 0 %
Lymphocytes Relative: 26 %
Lymphs Abs: 1.4 10*3/uL (ref 0.7–4.0)
MCH: 32.4 pg (ref 26.0–34.0)
MCHC: 33.7 g/dL (ref 30.0–36.0)
MCV: 96.1 fL (ref 80.0–100.0)
Monocytes Absolute: 0.6 10*3/uL (ref 0.1–1.0)
Monocytes Relative: 10 %
Neutro Abs: 3.2 10*3/uL (ref 1.7–7.7)
Neutrophils Relative %: 59 %
Platelets: 157 10*3/uL (ref 150–400)
RBC: 5.16 MIL/uL (ref 4.22–5.81)
RDW: 13.1 % (ref 11.5–15.5)
WBC: 5.5 10*3/uL (ref 4.0–10.5)
nRBC: 0 % (ref 0.0–0.2)

## 2021-06-14 LAB — ECHOCARDIOGRAM COMPLETE
Area-P 1/2: 3.21 cm2
Height: 74 in
S' Lateral: 3.6 cm
Weight: 4035.3 oz

## 2021-06-14 LAB — RAPID URINE DRUG SCREEN, HOSP PERFORMED
Amphetamines: NOT DETECTED
Barbiturates: NOT DETECTED
Benzodiazepines: NOT DETECTED
Cocaine: NOT DETECTED
Opiates: NOT DETECTED
Tetrahydrocannabinol: NOT DETECTED

## 2021-06-14 LAB — LIPID PANEL
Cholesterol: 142 mg/dL (ref 0–200)
HDL: 43 mg/dL (ref >40)
LDL Cholesterol: 72 mg/dL (ref 0–99)
Total CHOL/HDL Ratio: 3.3 RATIO
Triglycerides: 137 mg/dL (ref <150)
VLDL: 27 mg/dL (ref 0–40)

## 2021-06-14 LAB — HEMOGLOBIN A1C
Hgb A1c MFr Bld: 6.5 % — ABNORMAL HIGH (ref 4.8–5.6)
Mean Plasma Glucose: 139.85 mg/dL

## 2021-06-14 LAB — RESP PANEL BY RT-PCR (FLU A&B, COVID) ARPGX2
Influenza A by PCR: NEGATIVE
Influenza B by PCR: NEGATIVE
SARS Coronavirus 2 by RT PCR: NEGATIVE

## 2021-06-14 LAB — PHOSPHORUS: Phosphorus: 2.8 mg/dL (ref 2.5–4.6)

## 2021-06-14 LAB — TROPONIN I (HIGH SENSITIVITY)
Troponin I (High Sensitivity): 94 ng/L — ABNORMAL HIGH (ref <18)
Troponin I (High Sensitivity): 66 ng/L — ABNORMAL HIGH (ref <18)

## 2021-06-14 LAB — PROTIME-INR
INR: 1.7 — ABNORMAL HIGH (ref 0.8–1.2)
Prothrombin Time: 20 seconds — ABNORMAL HIGH (ref 11.4–15.2)

## 2021-06-14 LAB — MAGNESIUM: Magnesium: 1.9 mg/dL (ref 1.7–2.4)

## 2021-06-14 LAB — BRAIN NATRIURETIC PEPTIDE: B Natriuretic Peptide: 70.7 pg/mL (ref 0.0–100.0)

## 2021-06-14 MED ORDER — ESCITALOPRAM OXALATE 10 MG PO TABS
10.0000 mg | ORAL_TABLET | Freq: Every morning | ORAL | Status: DC
  Administered 2021-06-14 – 2021-06-17 (×4): 10 mg via ORAL
  Filled 2021-06-14 (×4): qty 1

## 2021-06-14 MED ORDER — ENOXAPARIN SODIUM 40 MG/0.4ML IJ SOSY: 40.0000 mg | PREFILLED_SYRINGE | INTRAMUSCULAR | Status: DC

## 2021-06-14 MED ORDER — LEVETIRACETAM 500 MG PO TABS
1000.0000 mg | ORAL_TABLET | Freq: Two times a day (BID) | ORAL | Status: DC
  Filled 2021-06-14: qty 2

## 2021-06-14 MED ORDER — LEVETIRACETAM 500 MG PO TABS: 1000.0000 mg | ORAL_TABLET | Freq: Two times a day (BID) | ORAL | Status: DC

## 2021-06-14 MED ORDER — VERAPAMIL HCL 40 MG PO TABS
40.0000 mg | ORAL_TABLET | Freq: Two times a day (BID) | ORAL | Status: DC
  Administered 2021-06-14 – 2021-06-17 (×7): 40 mg via ORAL
  Filled 2021-06-14 (×8): qty 1

## 2021-06-14 MED ORDER — ASPIRIN 81 MG PO CHEW
81.0000 mg | CHEWABLE_TABLET | Freq: Every day | ORAL | Status: DC
  Administered 2021-06-14 – 2021-06-17 (×4): 81 mg via ORAL
  Filled 2021-06-14 (×4): qty 1

## 2021-06-14 MED ORDER — LORAZEPAM 2 MG/ML IJ SOLN: 0.5000 mg | INTRAMUSCULAR | Status: DC | PRN

## 2021-06-14 MED ORDER — MAGNESIUM OXIDE -MG SUPPLEMENT 400 (240 MG) MG PO TABS
400.0000 mg | ORAL_TABLET | Freq: Every day | ORAL | Status: DC
  Administered 2021-06-14 – 2021-06-17 (×4): 400 mg via ORAL
  Filled 2021-06-14 (×4): qty 1

## 2021-06-14 MED ORDER — TRIAMCINOLONE ACETONIDE 0.1 % EX OINT
1.0000 "application " | TOPICAL_OINTMENT | Freq: Two times a day (BID) | CUTANEOUS | Status: DC
  Administered 2021-06-15 – 2021-06-17 (×4): 1 via TOPICAL
  Filled 2021-06-14: qty 15

## 2021-06-14 MED ORDER — DOXYCYCLINE HYCLATE 100 MG PO TABS
100.0000 mg | ORAL_TABLET | Freq: Two times a day (BID) | ORAL | Status: DC
  Administered 2021-06-14 – 2021-06-17 (×7): 100 mg via ORAL
  Filled 2021-06-14 (×7): qty 1

## 2021-06-14 MED ORDER — WARFARIN SODIUM 5 MG PO TABS
5.0000 mg | ORAL_TABLET | Freq: Once | ORAL | Status: AC
  Administered 2021-06-14: 5 mg via ORAL
  Filled 2021-06-14 (×2): qty 1

## 2021-06-14 MED ORDER — CLONAZEPAM 0.5 MG PO TABS
1.0000 mg | ORAL_TABLET | Freq: Two times a day (BID) | ORAL | Status: DC
  Administered 2021-06-14 – 2021-06-17 (×7): 1 mg via ORAL
  Filled 2021-06-14 (×7): qty 2

## 2021-06-14 MED ORDER — SODIUM CHLORIDE 0.9 % IV SOLN
200.0000 mg | Freq: Once | INTRAVENOUS | Status: AC
  Administered 2021-06-14: 200 mg via INTRAVENOUS
  Filled 2021-06-14: qty 20

## 2021-06-14 MED ORDER — ROSUVASTATIN CALCIUM 20 MG PO TABS
20.0000 mg | ORAL_TABLET | Freq: Every evening | ORAL | Status: DC
  Administered 2021-06-14 – 2021-06-16 (×3): 20 mg via ORAL
  Filled 2021-06-14 (×4): qty 1

## 2021-06-14 MED ORDER — MIRTAZAPINE 7.5 MG PO TABS
7.5000 mg | ORAL_TABLET | Freq: Every day | ORAL | Status: DC
  Administered 2021-06-14: 15 mg via ORAL
  Administered 2021-06-15: 7.5 mg via ORAL
  Administered 2021-06-16: 15 mg via ORAL
  Filled 2021-06-14 (×4): qty 2

## 2021-06-14 MED ORDER — PANTOPRAZOLE SODIUM 40 MG PO TBEC
80.0000 mg | DELAYED_RELEASE_TABLET | Freq: Every day | ORAL | Status: DC
  Administered 2021-06-14 – 2021-06-17 (×4): 80 mg via ORAL
  Filled 2021-06-14 (×4): qty 2

## 2021-06-14 MED ORDER — CLONAZEPAM 0.5 MG PO TABS
1.0000 mg | ORAL_TABLET | Freq: Two times a day (BID) | ORAL | Status: DC
  Filled 2021-06-14: qty 2

## 2021-06-14 MED ORDER — LACOSAMIDE 50 MG PO TABS
200.0000 mg | ORAL_TABLET | Freq: Two times a day (BID) | ORAL | Status: DC
  Filled 2021-06-14: qty 4

## 2021-06-14 MED ORDER — ACETAMINOPHEN 325 MG PO TABS
650.0000 mg | ORAL_TABLET | ORAL | Status: DC | PRN
  Administered 2021-06-15 – 2021-06-17 (×5): 650 mg via ORAL
  Filled 2021-06-14 (×7): qty 2

## 2021-06-14 MED ORDER — ACETAMINOPHEN 160 MG/5ML PO SOLN: 650.0000 mg | ORAL | Status: DC | PRN

## 2021-06-14 MED ORDER — WARFARIN - PHARMACIST DOSING INPATIENT: Freq: Every day | Status: DC

## 2021-06-14 MED ORDER — STROKE: EARLY STAGES OF RECOVERY BOOK: Freq: Once | Status: DC

## 2021-06-14 MED ORDER — ACETAMINOPHEN 650 MG RE SUPP: 650.0000 mg | RECTAL | Status: DC | PRN

## 2021-06-14 MED ORDER — LORAZEPAM 2 MG/ML IJ SOLN: 2.0000 mg | INTRAMUSCULAR | Status: DC | PRN

## 2021-06-14 MED ORDER — LACOSAMIDE 50 MG PO TABS: 200.0000 mg | ORAL_TABLET | Freq: Two times a day (BID) | ORAL | Status: DC

## 2021-06-14 MED ORDER — LEVETIRACETAM IN NACL 1000 MG/100ML IV SOLN
1000.0000 mg | Freq: Once | INTRAVENOUS | Status: AC
  Administered 2021-06-14: 1000 mg via INTRAVENOUS
  Filled 2021-06-14: qty 100

## 2021-06-14 MED ORDER — FLUTICASONE PROPIONATE 50 MCG/ACT NA SUSP: 1.0000 | Freq: Every day | NASAL | Status: DC | PRN

## 2021-06-14 MED ORDER — METOPROLOL SUCCINATE ER 50 MG PO TB24
50.0000 mg | ORAL_TABLET | Freq: Every day | ORAL | Status: DC
  Administered 2021-06-14 – 2021-06-17 (×4): 50 mg via ORAL
  Filled 2021-06-14 (×2): qty 1
  Filled 2021-06-14: qty 2
  Filled 2021-06-14: qty 1

## 2021-06-14 MED ORDER — LORAZEPAM 2 MG/ML IJ SOLN
0.5000 mg | Freq: Once | INTRAMUSCULAR | Status: AC
  Administered 2021-06-14: 0.5 mg via INTRAVENOUS
  Filled 2021-06-14: qty 1

## 2021-06-14 NOTE — Progress Notes (Addendum)
Called by ED RN: Pt having seizure like activity with hand twitching / eye twitching, followed by confusion.  1) 0.5mg  ativan now 2) 1gm keppra now (hadnt had PO keppra yet tonight) 3) 200mg  Vimpat IV (hasnt had PO vimpat yet tonight)

## 2021-06-14 NOTE — Progress Notes (Signed)
  Echocardiogram 2D Echocardiogram has been performed.  Edward Arellano 06/14/2021, 10:57 AM

## 2021-06-14 NOTE — Progress Notes (Addendum)
Subjective: Wife at bedside states that unfortunately patient had an episode where she noticed twitching of upper extremities, eyes open and not responding for less than a minute.  States he had a similar episode years ago when he was seen at [facility].  Denies any other similar episodes in the last few years.  ROS: negative except above  Examination  Vital signs in last 24 hours: Temp:  [97.3 F (36.3 C)] 97.3 F (36.3 C) (07/12 2339) Pulse Rate:  [55-68] 66 (07/13 1030) Resp:  [8-27] 18 (07/13 1030) BP: (116-156)/(73-92) 133/88 (07/13 1152) SpO2:  [92 %-97 %] 95 % (07/13 1030) Weight:  [114.4 kg] 114.4 kg (07/12 2339)  General: lying in bed, not in apparent distress, CTAB CVS: pulse-normal rate and rhythm RS: breathing comfortably Extremities: normal   Neuro: MS: Alert, oriented, follows commands CN: pupils equal and reactive,  EOMI, face symmetric, tongue midline, normal sensation over face, Motor: 5/5 strength in all 4 extremities Reflexes: 2+ bilaterally over patella, biceps, plantars: flexor Coordination: normal Gait: not tested  Basic Metabolic Panel: Recent Labs  Lab 06/13/21 2245 06/13/21 2249  NA 137 139  K 4.3 4.2  CL 104 103  CO2 25  --   GLUCOSE 106* 106*  BUN 10 13  CREATININE 1.03 1.00  CALCIUM 9.0  --     CBC: Recent Labs  Lab 06/13/21 2245 06/13/21 2249  WBC 7.9  --   NEUTROABS 4.7  --   HGB 16.8 17.0  HCT 50.4 50.0  MCV 96.2  --   PLT 204  --      Coagulation Studies: Recent Labs    06/13/21 2245  LABPROT 19.6*  INR 1.7*    Imaging CT head without contrast 06/13/2021: Chronic ischemic microangiopathy without acute intracranial abnormality.  CTA head and neck 06/13/2021; Normal CTA of the head and neck. Normal CT perfusion.   ASSESSMENT AND PLAN: 70 year old male with episode of transient whole-body twitching associated with speech disturbance and confusion.  Transient alteration of awareness Speech disturbance Generalized  twitching -Differential for the episode include seizures versus sleep disorder versus TIA  Recommendations -We will obtain video EEG monitoring for characterization of spells -Per review of previous neurology records, patient has had multiple normal EEGs but no episode has been captured.  It was even discussed at her most recent neurology appointment consider reducing AEDs but patient was hesitant to make any changes. -We will hold Keppra and Vimpat to increase the sensitivity of video EEG -On Klonopin for anxiety, will continue for now - MRI brain after EEG to look for any acute intracranial abnormality -Seizure precautions -As needed IV Ativan 2 mg for clinical seizure-like activity -Management of rest of comorbidities per primary team  I have spent a total of  35  minutes with the patient reviewing hospital notes,  test results, labs and examining the patient as well as establishing an assessment and plan that was discussed personally with the patient, his wife at bedside.  > 50% of time was spent in direct patient care.   Lindie Spruce Epilepsy [facility] For questions after 5pm please refer to AMION to reach the Neurologist on call

## 2021-06-14 NOTE — Progress Notes (Signed)
Care started prior to midnight in the emergency room and patient was admitted early this morning after midnight by Dr. Nestor Lewandowsky and I am in current agreement with this assessment and plan.  Additional changes to the plan of care have been made accordingly.  The patient is a 70 year old obese male with a past medical history significant for but not limited to proximal atrial fibrillation on anticoagulation with Coumadin, history of seizure disorder on Keppra and Vimpat, history of CAD status post CABG, hypertension, diabetes mellitus type 2 that is diet-controlled, history of CVA, history of permanent pacemaker placement as well as other comorbidities who presented to the ED as a code stroke for acute onset of speech deficit.  Originally EMS was called to the patient's home for chest pain.  On arrival patient initially communicated normally but then followed by a sudden onset of receptive and expressive dysphagia along with difficulty following commands as well as left-sided weakness.  Upon arrival to the ED his symptoms resolved and there was no chest pain or shortness of breath.  Neurology evaluated and felt that his differential diagnosis for presentation includes psychiatric/behavioral etiology and acute encephalopathy but did not really favor stroke versus TIA.  Patient had a head CT done which showed no acute intracranial abnormality and his INR was subtherapeutic.  Patient had a CTA of his head and neck was normal and a CT perfusion of his brain which was normal.  Given the low likelihood of stroke the potential benefits of tPA were significantly outweighed by the risks.  Neurology recommended continue home doses of Vimpat 200 mg twice daily and Keppra 1000 mg twice daily however there is patient's for poor intake and twitching and seizure-like activity he is given this IV.  We will obtain an EEG and if this is negative he may benefit from a 24-hour EEG monitoring.  Unfortunately patient's pacemaker is  not MRI compatible.  We will continue with neurochecks and appreciate further Neurology reccomendations. Currently the patient is being admitted and treaded for the following but not limited too:  Stroke-like symptoms - Transient Speech Disturbance and Confusion  -Neurology thinks acute stroke/TIA is possible but less likely and that the transition alteration of awareness and speech disturbance as well as generalized twitching could be a differential for seizure versus sleep disorder -? Acute encephalopathy from subclinical seizure activity? -Neurology recommending video EEG monitoring characterization of spells and holding his Keppra and Vimpat to increase the sensitivity of EEG.  They are recommending continuing Klonopin for anxiety and recommending possible MRI after EEG to look for any intracranial abnormalities however -MRI brain unable to be done given Pacemaker Information at this institution -Cont ASA 81 for now- -Continue crestor -Appreciate neurology recommendations  A.Fib  -Continue Calan -Cont metoprolol -Continue coumadin per pharm -Continue to Monitor on Telemetry  Dyspnea on Exertion -Check ECHO and BNP; BNP was 70.7 -Troponin was 94 and titrated down to 66 -Repeat CXR in the Am  -Will need an Ambulatory Home O2 screen prior to D/C  Diabetes Mellitus Type 2 -HbA1c was 6.5 -Continue to Monitor and trend blood Sugars carefully -If Necessary will place on Sensitive Novolog AC HS   Seizure Disorder  -Holding keppra and Vimpat -Seiuzure Precautions -Checking EEG -Neurology consulted for further evaluation and recommendations  Anxiety -Cont Klonopin  Recent cellulitis / 'spider bite'  -Continue Doxycycline BID  Obesity -Complicates overall care and prognosis -Estimated body mass index is 32.38 kg/m as calculated from the following:   Height as of  this encounter: 6\' 2"  (1.88 m).   Weight as of this encounter: 114.4 kg. -Weight Loss and Dietary Counseling  given  Will continue to monitor the patient's clinical response to intervention and follow-up neurology recommendations and evaluation.  He will need repeat blood work and imaging in the a.m. as well as a PT OT evaluation

## 2021-06-14 NOTE — Progress Notes (Signed)
PT Cancellation Note  Patient Details Name: Edward Arellano MRN: [id number] DOB: 20-Mar-1951   Cancelled Treatment:    Reason Eval/Treat Not Completed: Patient not medically ready Spoke with pts nurse, pt with new seizure activity and getting set up for EEG.  Not stable for PT today.  Recommended holding PT eval today.  Anise Salvo, PT Acute Rehab Services Pager [phone] [facility] [phone]   Rayetta Humphrey 06/14/2021, 11:10 AM

## 2021-06-14 NOTE — Progress Notes (Signed)
Pt has an unsafe pacemaker model [id number] with two lead models 1888 TC from [facility]. [employer]. This information has been scanned into his chart. We are unable to proceed with any exams due to the status of this implant.

## 2021-06-14 NOTE — Progress Notes (Signed)
Patient received to the unit. Patient is alert and oriented x4. Iv in place. Skin assessment done with another nurse. Given instructions about call bell and phone. Bed in low position and call bell in reach. 

## 2021-06-14 NOTE — Progress Notes (Signed)
EEG Completed; Results Pending LTM to follow. 

## 2021-06-14 NOTE — ED Notes (Signed)
Pt ate a meal and drank a can on soda and OJ.

## 2021-06-14 NOTE — ED Notes (Signed)
In room to medicate pt and move him to inpatient stretcher, pt noted to have straightening of legs, twitching and rapid blinking of eyes. Pt aroused with repeated verbal stimuli, pt awake, confused. Admitting MD notified, orders given.

## 2021-06-14 NOTE — Progress Notes (Signed)
ANTICOAGULATION CONSULT NOTE - Initial Consult  Pharmacy Consult for warfarin Indication: atrial fibrillation  Allergies  Allergen Reactions   Nitroglycerin Palpitations   Cyclobenzaprine Other (See Comments)    Dry mouth   Niacin Other (See Comments)    flushing   Tizanidine Other (See Comments)    Dry mouth   Balsam Rash    Positive Patch Test 05/18/2021   Benzoic Acid Rash    Positive Patch Test 05/18/2021    Patient Measurements: Height: 6\' 2"  (188 cm) Weight: 114.4 kg (252 lb 3.3 oz) IBW/kg (Calculated) : 82.2  Vital Signs: Temp: 97.3 F (36.3 C) (07/12 2339) Temp Source: Temporal (07/12 2339) BP: 137/84 (07/13 0115) Pulse Rate: 67 (07/13 0115)  Labs: Recent Labs    06/13/21 2245 06/13/21 2249  HGB 16.8 17.0  HCT 50.4 50.0  PLT 204  --   APTT 31  --   LABPROT 19.6*  --   INR 1.7*  --   CREATININE 1.03 1.00    Estimated Creatinine Clearance: 89.8 mL/min (by C-G formula based on SCr of 1 mg/dL).   Medical History: Past Medical History:  Diagnosis Date   Anxiety    Arthritis    CIDP (chronic inflammatory demyelinating polyneuropathy) (HCC)    Complication of anesthesia    Takes alot to sedate patient.   Coronary artery disease    Diabetes mellitus with neuropathy (HCC)    A1C 5.0   GERD (gastroesophageal reflux disease)    corrected with Hital Hernia   H/O hiatal hernia    corrected   Headache(784.0)    sees a neuroologist- Dr. 08/14/21   Hypertension    Mental disorder    OCD.      Paroxysmal A-fib (HCC)    Pacemaker in place   Presence of permanent cardiac pacemaker    Seizure disorder (HCC)    Sleep apnea    USES CPAP   Stroke [facility])    TIA (transient ischemic attack)     Assessment: 70yo male presents to ED as code stroke, alert was canceled after neuro exam, admitted for further w/u, to continue Coumadin for PAF; current INR below goal at 1.7, last dose taken 7/12; of note per OV notes pt should be taking 3.75mg  daily except  2.5mg  on Mondays but pt and wife report to med hx tech that he takes 3.75mg  daily except 2.5mg  on Wednesdays; the dose was changed at appt <10mo ago as it's unclear whether they've been following the new instructions and forgot on interview with tech vs reverting back to old instructions.  Goal of Therapy:  INR 2-3   Plan:  Coumadin 5mg  PO x1 today. Monitor INR for dose adjustments.  3mo, PharmD, BCPS  06/14/2021,1:43 AM

## 2021-06-14 NOTE — ED Notes (Signed)
Messaged hospitalist concerning pt's diet. Pt is NPO but has passed swallow screen. Unable to give meds to pt with NPO diet. Waiting for response.

## 2021-06-14 NOTE — Procedures (Signed)
Patient Name: Edward Arellano  MRN: [id number]  Epilepsy Attending: Charlsie Quest  Referring Physician/Provider: Dr Lindie Spruce Date: 06/14/2021 Duration: 25.46 mins  Patient history: 70 year old history of seizure-like episodes who presented with an episode of twitching and altered awareness.  EEG to evaluate for seizures.  Level of alertness: Awake, drowsy  AEDs during EEG study: Klonopin, Keppra, Vimpat  Technical aspects: This EEG study was done with scalp electrodes positioned according to the 10-20 International system of electrode placement. Electrical activity was acquired at a sampling rate of 500Hz  and reviewed with a high frequency filter of 70Hz  and a low frequency filter of 1Hz . EEG data were recorded continuously and digitally stored.   Description: The posterior dominant rhythm consists of  7.5 Hz activity of moderate voltage (25-35 uV) seen predominantly in posterior head regions, symmetric and reactive to eye opening and eye closing. Drowsiness was characterized by attenuation of the posterior background rhythm.  Hyperventilation and photic stimulation were not performed.     IMPRESSION: This study is within normal limits. No seizures or epileptiform discharges were seen throughout the recording.  Orvel Cutsforth 

## 2021-06-14 NOTE — ED Notes (Signed)
Hospital bed ordered for pt.

## 2021-06-14 NOTE — Progress Notes (Signed)
LTM started; no skin breakdown was seen. Pt in ED so not monitored yet. Tested event button and educated nurse and wife.

## 2021-06-14 NOTE — H&P (Signed)
History and Physical    Edward Arellano:[id number] DOB: [birth year] DOA: 06/13/2021  PCP: Elspeth Cho., MD  Patient coming from: Home  I have personally briefly reviewed patient's old medical records in [facility]  Chief Complaint: Speech deficit  HPI: Edward Arellano is a 70 y.o. male with medical history significant of PAF on coumadin, seizure disorder on keppra and vimpat, CAD s/p CABG, HTN, DM2 (diet controlled), prior stroke, pacemaker placement.  Pt presents to ED as a code stroke for acute onset of speech deficit.  EMS originally called to pt home for CP.  On arrival patient initially communicated normally followed by sudden onset of receptive and expressive dysphasia and difficulty following commands.  Report of L sided weakness.  Symptoms resolved on arrival.  No CP, SOB, or other symptoms at this time.   ED Course: Pt then had an episode of abnormal speech, attention, with neurology present.  Pt back to baseline at this time.   Review of Systems: As per HPI, otherwise all review of systems negative.  Past Medical History:  Diagnosis Date   Anxiety    Arthritis    CIDP (chronic inflammatory demyelinating polyneuropathy) (HCC)    Complication of anesthesia    Takes alot to sedate patient.   Coronary artery disease    Diabetes mellitus with neuropathy (HCC)    A1C 5.0   GERD (gastroesophageal reflux disease)    corrected with Hital Hernia   H/O hiatal hernia    corrected   Headache(784.0)    sees a neuroologist- Dr. Celine Mans   Hypertension    Mental disorder    OCD.      Paroxysmal A-fib (HCC)    Pacemaker in place   Presence of permanent cardiac pacemaker    Seizure disorder (HCC)    Sleep apnea    USES CPAP   Stroke (HCC)    TIA (transient ischemic attack)     Past Surgical History:  Procedure Laterality Date   APPENDECTOMY     Colonscopy     CORONARY ARTERY BYPASS GRAFT  2001   HERNIA REPAIR     INGUINAL HERNIA REPAIR     Right  and Left   LUMBAR FUSION     PACEMAKER PLACEMENT  11/24/2013   Probably MRI compatible since [facility] did MRI in 01/2018   PERCUTANEOUS PINNING TOE FRACTURE     right 5th toe     reports that he has never smoked. He has never used smokeless tobacco. He reports that he does not drink alcohol and does not use drugs.  Allergies  Allergen Reactions   Nitroglycerin Palpitations   Cyclobenzaprine Other (See Comments)    Dry mouth   Niacin Other (See Comments)    flushing   Tizanidine Other (See Comments)    Dry mouth   Balsam Rash    Positive Patch Test 05/18/2021   Benzoic Acid Rash    Positive Patch Test 05/18/2021    Family History  Problem Relation Age of Onset   Cancer Mother    Prostate cancer Father    Cancer Brother      Prior to Admission medications   Medication Sig Start Date End Date Taking? Authorizing Provider  acetaminophen (TYLENOL) 325 MG tablet Take 650 mg by mouth in the morning and at bedtime.   Yes [provider]  aspirin 81 MG EC tablet Take 81 mg by mouth daily.   Yes [provider]  cholecalciferol (VITAMIN D) 1000  units tablet Take 1,000 Units by mouth daily.   Yes [provider]  clonazePAM (KLONOPIN) 1 MG tablet Take 1 mg by mouth 2 (two) times daily. Name brand only    Yes [provider]  doxycycline (VIBRAMYCIN) 100 MG capsule Take 100 mg by mouth See admin instructions. Bid x 10 days 06/12/21  Yes [provider]  escitalopram (LEXAPRO) 10 MG tablet Take 10 mg by mouth every morning.   Yes [provider]  esomeprazole (NEXIUM) 40 MG capsule Take 40 mg by mouth daily.   Yes [provider]  fluticasone (FLONASE) 50 MCG/ACT nasal spray Place 1 spray into both nostrils daily as needed for allergies. 03/08/21  Yes [provider]  levETIRAcetam (KEPPRA) 1000 MG tablet Take 1,000 mg by mouth 2 (two) times daily. 06/02/21  Yes [provider]  MAGNESIUM-OXIDE 400 (240 Mg) MG tablet  Take 1 tablet by mouth daily. 05/16/21  Yes [provider]  Menthol, Topical Analgesic, (BIOFREEZE EX) Apply 1 application topically daily as needed (pain).   Yes [provider]  metoprolol succinate (TOPROL-XL) 50 MG 24 hr tablet Take 50 mg by mouth daily. Take with or immediately following a meal.   Yes [provider]  mirtazapine (REMERON) 15 MG tablet Take 7.5-15 mg by mouth at bedtime. 05/26/21  Yes [provider]  rosuvastatin (CRESTOR) 20 MG tablet Take 20 mg by mouth every evening.   Yes [provider]  triamcinolone ointment (KENALOG) 0.1 % Apply 1 application topically in the morning and at bedtime. Apply to spider bite 06/12/21  Yes [provider]  verapamil (CALAN) 40 MG tablet Take 40 mg by mouth in the morning and at bedtime. 04/20/19  Yes [provider]  VIMPAT 200 MG TABS tablet Take 200 mg by mouth 2 (two) times daily. 05/18/21  Yes [provider]  vitamin B-12 (CYANOCOBALAMIN) 1000 MCG tablet Take 1,000 mcg by mouth daily.   Yes [provider]  vitamin C (ASCORBIC ACID) 500 MG tablet Take 500 mg by mouth at bedtime.   Yes [provider]  warfarin (COUMADIN) 2.5 MG tablet Take 2.5 mg by mouth See admin instructions. 1&1/2 daily 2.5 mg Monday and wednesday 05/11/19  Yes [provider]    Physical Exam: Vitals:   06/14/21 0030 06/14/21 0115 06/14/21 0130 06/14/21 0145  BP: (!) 144/81 137/84 137/87 (!) 135/91  Pulse: 61 67 64 63  Resp: 19 14 16 17   Temp:      TempSrc:      SpO2: 97% 93% 94% 94%  Weight:      Height:        Constitutional: NAD, calm, comfortable Eyes: PERRL, lids and conjunctivae normal ENMT: Mucous membranes are moist. Posterior pharynx clear of any exudate or lesions.Normal dentition.  Neck: normal, supple, no masses, no thyromegaly Respiratory: clear to auscultation bilaterally, no wheezing, no crackles. Normal respiratory effort. No accessory muscle  use.  Cardiovascular: Regular rate and rhythm, no murmurs / rubs / gallops. No extremity edema. 2+ pedal pulses. No carotid bruits.  Abdomen: no tenderness, no masses palpated. No hepatosplenomegaly. Bowel sounds positive.  Musculoskeletal: no clubbing / cyanosis. No joint deformity upper and lower extremities. Good ROM, no contractures. Normal muscle tone.  Skin: no rashes, lesions, ulcers. No induration Neurologic: CN 2-12 grossly intact. Sensation intact, DTR normal. Strength 5/5 in all 4.  Psychiatric: Normal judgment and insight. Alert and oriented x 3. Normal mood.    Labs on Admission:  I have personally reviewed following labs and imaging studies  CBC: Recent Labs  Lab 06/13/21 2245 06/13/21 2249  WBC 7.9  --   NEUTROABS 4.7  --   HGB 16.8 17.0  HCT 50.4 50.0  MCV 96.2  --   PLT 204  --    Basic Metabolic Panel: Recent Labs  Lab 06/13/21 2245 06/13/21 2249  NA 137 139  K 4.3 4.2  CL 104 103  CO2 25  --   GLUCOSE 106* 106*  BUN 10 13  CREATININE 1.03 1.00  CALCIUM 9.0  --    GFR: Estimated Creatinine Clearance: 89.8 mL/min (by C-G formula based on SCr of 1 mg/dL). Liver Function Tests: Recent Labs  Lab 06/13/21 2245  AST 33  ALT 23  ALKPHOS 95  BILITOT 0.5  PROT 6.5  ALBUMIN 3.6   No results for input(s): LIPASE, AMYLASE in the last 168 hours. No results for input(s): AMMONIA in the last 168 hours. Coagulation Profile: Recent Labs  Lab 06/13/21 2245  INR 1.7*   Cardiac Enzymes: No results for input(s): CKTOTAL, CKMB, CKMBINDEX, TROPONINI in the last 168 hours. BNP (last 3 results) No results for input(s): PROBNP in the last 8760 hours. HbA1C: No results for input(s): HGBA1C in the last 72 hours. CBG: Recent Labs  Lab 06/13/21 2245  GLUCAP 114*   Lipid Profile: No results for input(s): CHOL, HDL, LDLCALC, TRIG, CHOLHDL, LDLDIRECT in the last 72 hours. Thyroid Function Tests: No results for input(s): TSH, T4TOTAL, FREET4, T3FREE, THYROIDAB  in the last 72 hours. Anemia Panel: No results for input(s): VITAMINB12, FOLATE, FERRITIN, TIBC, IRON, RETICCTPCT in the last 72 hours. Urine analysis:    Component Value Date/Time   COLORURINE YELLOW 12/21/2016 1119   APPEARANCEUR CLEAR 12/21/2016 1119   LABSPEC 1.015 12/21/2016 1119   PHURINE 6.0 12/21/2016 1119   GLUCOSEU NEGATIVE 12/21/2016 1119   HGBUR NEGATIVE 12/21/2016 1119   BILIRUBINUR NEGATIVE 12/21/2016 1119   KETONESUR NEGATIVE 12/21/2016 1119   PROTEINUR NEGATIVE 12/21/2016 1119   NITRITE NEGATIVE 12/21/2016 1119   LEUKOCYTESUR NEGATIVE 12/21/2016 1119    Radiological Exams on Admission: DG Chest Portable 1 View  Result Date: 06/13/2021 CLINICAL DATA:  70 year old male with chest pain. EXAM: PORTABLE CHEST 1 VIEW COMPARISON:  Chest radiograph dated 06/13/2021. FINDINGS: There is cardiomegaly with mild vascular congestion. Minimal bibasilar atelectasis. No focal consolidation, pleural effusion, or pneumothorax. Median sternotomy wires. Left pectoral pacemaker device. No acute osseous pathology. IMPRESSION: Cardiomegaly with mild vascular congestion.  No focal consolidation. Electronically Signed   By: Elgie Collard M.D.   On: 06/13/2021 23:43   CT HEAD CODE STROKE WO CONTRAST  Result Date: 06/13/2021 CLINICAL DATA:  Code stroke.  Slurred speech with left arm weakness EXAM: CT HEAD WITHOUT CONTRAST TECHNIQUE: Contiguous axial images were obtained from the base of the skull through the vertex without intravenous contrast. COMPARISON:  03/08/2021 FINDINGS: Brain: There is no mass, hemorrhage or extra-axial collection. The size and configuration of the ventricles and extra-axial CSF spaces are normal. There is hypoattenuation of the periventricular white matter, most commonly indicating chronic ischemic microangiopathy. Vascular: No abnormal hyperdensity of the major intracranial arteries or dural venous sinuses. No intracranial atherosclerosis. Skull: The visualized skull base,  calvarium and extracranial soft tissues are normal. Sinuses/Orbits: No fluid levels or advanced mucosal thickening of the visualized paranasal sinuses. No mastoid or middle ear effusion. The orbits are normal. ASPECTS [facility] Stroke Program Early CT Score) - Ganglionic level infarction (caudate, lentiform nuclei, internal  capsule, insula, M1-M3 cortex): 7 - Supraganglionic infarction (M4-M6 cortex): 3 Total score (0-10 with 10 being normal): 10 IMPRESSION: 1. Chronic ischemic microangiopathy without acute intracranial abnormality. 2. ASPECTS is 10. These results were communicated to Dr. Caryl PinaEric Lindzen at 11:11 pm on 06/13/2021 by text page via the [facility] messaging system. Electronically Signed   By: Deatra RobinsonKevin  Herman M.D.   On: 06/13/2021 23:11   CT ANGIO HEAD NECK W WO CM W PERF (CODE STROKE)  Result Date: 06/13/2021 CLINICAL DATA:  Slurred speech with left-sided weakness EXAM: CT ANGIOGRAPHY HEAD AND NECK CT PERFUSION BRAIN TECHNIQUE: Multidetector CT imaging of the head and neck was performed using the standard protocol during bolus administration of intravenous contrast. Multiplanar CT image reconstructions and MIPs were obtained to evaluate the vascular anatomy. Carotid stenosis measurements (when applicable) are obtained utilizing NASCET criteria, using the distal internal carotid diameter as the denominator. Multiphase CT imaging of the brain was performed following IV bolus contrast injection. Subsequent parametric perfusion maps were calculated using RAPID software. CONTRAST:  100mL OMNIPAQUE IOHEXOL 350 MG/ML SOLN COMPARISON:  None. FINDINGS: CTA NECK FINDINGS SKELETON: There is no bony spinal canal stenosis. No lytic or blastic lesion. OTHER NECK: Normal pharynx, larynx and major salivary glands. No cervical lymphadenopathy. Unremarkable thyroid gland. UPPER CHEST: No pneumothorax or pleural effusion. No nodules or masses. AORTIC ARCH: There is no calcific atherosclerosis of the aortic arch. There is no  aneurysm, dissection or hemodynamically significant stenosis of the visualized portion of the aorta. Conventional 3 vessel aortic branching pattern. The visualized proximal subclavian arteries are widely patent. RIGHT CAROTID SYSTEM: Normal without aneurysm, dissection or stenosis. LEFT CAROTID SYSTEM: Normal without aneurysm, dissection or stenosis. VERTEBRAL ARTERIES: Left dominant configuration. Both origins are clearly patent. There is no dissection, occlusion or flow-limiting stenosis to the skull base (V1-V3 segments). CTA HEAD FINDINGS POSTERIOR CIRCULATION: --Vertebral arteries: Normal V4 segments. --Inferior cerebellar arteries: Normal. --Basilar artery: Normal. --Superior cerebellar arteries: Normal. --Posterior cerebral arteries (PCA): Normal. ANTERIOR CIRCULATION: --Intracranial internal carotid arteries: Normal. --Anterior cerebral arteries (ACA): Normal. Both A1 segments are present. Patent anterior communicating artery (a-comm). --Middle cerebral arteries (MCA): Normal. VENOUS SINUSES: As permitted by contrast timing, patent. ANATOMIC VARIANTS: None Review of the MIP images confirms the above findings. CT Brain Perfusion Findings: ASPECTS: 10 CBF (<30%) Volume: 0mL Perfusion (Tmax>6.0s) volume: 0mL Mismatch Volume: 0mL Infarction Location:None IMPRESSION: 1. Normal CTA of the head and neck. 2. Normal CT perfusion. Electronically Signed   By: Deatra RobinsonKevin  Herman M.D.   On: 06/13/2021 23:29    EKG: Independently reviewed.  Assessment/Plan Principal Problem:   Stroke-like symptoms Active Problems:   Persistent atrial fibrillation (HCC)   H/O: stroke   Seizure disorder (HCC)   Pacemaker    Stroke-like symptoms - See neuro note They think acute stroke/TIA is possible but less likely. ? Acute encephalopathy from subclinical seizure activity? Stroke pathway 2d echo MRI brain possibly (if PPM compatible) [facility] But rad tech says PPM not compatible... Cont ASA 81  for now Cont crestor A.Fib - Continue Calan Cont metoprolol Continue coumadin per pharm Seizure disorder - Cont keppra and vimpat Anxiety - Cont Klonopin Recent cellulitis / 'spider bite' - Cont doxycycline BID  DVT prophylaxis: Coumadin Code Status: Full Family Communication: Wife at bedside Disposition Plan: Home after neuro clearance Consults called: Dr. Otelia LimesLindzen Admission status: Place in obs     Adir Schicker M. DO [facility]  How to contact the [facility] Attending  or Consulting provider 7A - 7P or covering provider during after hours 7P -7A, for this patient?  Check the care team in [facility] and look for a) attending/consulting [facility] provider listed and b) the [facility] team listed Log into [email or web address]  Amion Physician Scheduling and messaging for groups and whole hospitals  On call and physician scheduling software for group practices, residents, hospitalists and other medical providers for call, clinic, rotation and shift schedules. OnCall Enterprise is a hospital-wide system for scheduling doctors and paging doctors on call. EasyPlot is for scientific plotting and data analysis.  [email or web address]  and use [facility]'s universal password to access. If you do not have the password, please contact the hospital operator.  Locate the [facility] provider you are looking for under [facility] and page to a number that you can be directly reached. If you still have difficulty reaching the provider, please page the [facility] (Director on Call) for the Hospitalists listed on amion for assistance.  06/14/2021, 2:04 AM

## 2021-06-15 DIAGNOSIS — F32A Depression, unspecified: Secondary | ICD-10-CM | POA: Diagnosis present

## 2021-06-15 DIAGNOSIS — I4819 Other persistent atrial fibrillation: Secondary | ICD-10-CM | POA: Diagnosis present

## 2021-06-15 DIAGNOSIS — R2990 Unspecified symptoms and signs involving the nervous system: Secondary | ICD-10-CM | POA: Diagnosis present

## 2021-06-15 DIAGNOSIS — I10 Essential (primary) hypertension: Secondary | ICD-10-CM | POA: Diagnosis present

## 2021-06-15 DIAGNOSIS — Z950 Presence of cardiac pacemaker: Secondary | ICD-10-CM | POA: Diagnosis not present

## 2021-06-15 DIAGNOSIS — R471 Dysarthria and anarthria: Secondary | ICD-10-CM

## 2021-06-15 DIAGNOSIS — K219 Gastro-esophageal reflux disease without esophagitis: Secondary | ICD-10-CM | POA: Diagnosis present

## 2021-06-15 DIAGNOSIS — E669 Obesity, unspecified: Secondary | ICD-10-CM | POA: Diagnosis present

## 2021-06-15 DIAGNOSIS — Z20822 Contact with and (suspected) exposure to covid-19: Secondary | ICD-10-CM | POA: Diagnosis present

## 2021-06-15 DIAGNOSIS — R1310 Dysphagia, unspecified: Secondary | ICD-10-CM | POA: Diagnosis present

## 2021-06-15 DIAGNOSIS — E1140 Type 2 diabetes mellitus with diabetic neuropathy, unspecified: Secondary | ICD-10-CM | POA: Diagnosis present

## 2021-06-15 DIAGNOSIS — F429 Obsessive-compulsive disorder, unspecified: Secondary | ICD-10-CM | POA: Diagnosis present

## 2021-06-15 DIAGNOSIS — R4702 Dysphasia: Secondary | ICD-10-CM | POA: Diagnosis present

## 2021-06-15 DIAGNOSIS — I2510 Atherosclerotic heart disease of native coronary artery without angina pectoris: Secondary | ICD-10-CM | POA: Diagnosis present

## 2021-06-15 DIAGNOSIS — G4730 Sleep apnea, unspecified: Secondary | ICD-10-CM | POA: Diagnosis present

## 2021-06-15 DIAGNOSIS — Z951 Presence of aortocoronary bypass graft: Secondary | ICD-10-CM | POA: Diagnosis not present

## 2021-06-15 DIAGNOSIS — Z6832 Body mass index (BMI) 32.0-32.9, adult: Secondary | ICD-10-CM | POA: Diagnosis not present

## 2021-06-15 DIAGNOSIS — R253 Fasciculation: Secondary | ICD-10-CM | POA: Diagnosis present

## 2021-06-15 DIAGNOSIS — F419 Anxiety disorder, unspecified: Secondary | ICD-10-CM | POA: Diagnosis present

## 2021-06-15 DIAGNOSIS — G40909 Epilepsy, unspecified, not intractable, without status epilepticus: Secondary | ICD-10-CM | POA: Diagnosis present

## 2021-06-15 DIAGNOSIS — G8194 Hemiplegia, unspecified affecting left nondominant side: Secondary | ICD-10-CM | POA: Diagnosis present

## 2021-06-15 DIAGNOSIS — G6181 Chronic inflammatory demyelinating polyneuritis: Secondary | ICD-10-CM | POA: Diagnosis present

## 2021-06-15 DIAGNOSIS — Z8673 Personal history of transient ischemic attack (TIA), and cerebral infarction without residual deficits: Secondary | ICD-10-CM | POA: Diagnosis not present

## 2021-06-15 DIAGNOSIS — R29703 NIHSS score 3: Secondary | ICD-10-CM | POA: Diagnosis present

## 2021-06-15 DIAGNOSIS — Z981 Arthrodesis status: Secondary | ICD-10-CM | POA: Diagnosis not present

## 2021-06-15 LAB — CBC WITH DIFFERENTIAL/PLATELET
Abs Immature Granulocytes: 0.07 10*3/uL (ref 0.00–0.07)
Basophils Absolute: 0 10*3/uL (ref 0.0–0.1)
Basophils Relative: 0 %
Eosinophils Absolute: 0.2 10*3/uL (ref 0.0–0.5)
Eosinophils Relative: 2 %
HCT: 47 % (ref 39.0–52.0)
Hemoglobin: 16.3 g/dL (ref 13.0–17.0)
Immature Granulocytes: 1 %
Lymphocytes Relative: 21 %
Lymphs Abs: 1.6 10*3/uL (ref 0.7–4.0)
MCH: 32.8 pg (ref 26.0–34.0)
MCHC: 34.7 g/dL (ref 30.0–36.0)
MCV: 94.6 fL (ref 80.0–100.0)
Monocytes Absolute: 0.7 10*3/uL (ref 0.1–1.0)
Monocytes Relative: 9 %
Neutro Abs: 5 10*3/uL (ref 1.7–7.7)
Neutrophils Relative %: 67 %
Platelets: 177 10*3/uL (ref 150–400)
RBC: 4.97 MIL/uL (ref 4.22–5.81)
RDW: 13.2 % (ref 11.5–15.5)
WBC: 7.5 10*3/uL (ref 4.0–10.5)
nRBC: 0 % (ref 0.0–0.2)

## 2021-06-15 LAB — COMPREHENSIVE METABOLIC PANEL
ALT: 20 U/L (ref 0–44)
AST: 32 U/L (ref 15–41)
Albumin: 3.1 g/dL — ABNORMAL LOW (ref 3.5–5.0)
Alkaline Phosphatase: 80 U/L (ref 38–126)
Anion gap: 7 (ref 5–15)
BUN: 13 mg/dL (ref 8–23)
CO2: 25 mmol/L (ref 22–32)
Calcium: 8.9 mg/dL (ref 8.9–10.3)
Chloride: 106 mmol/L (ref 98–111)
Creatinine, Ser: 1.09 mg/dL (ref 0.61–1.24)
GFR, Estimated: >60 mL/min (ref >60)
Glucose, Bld: 120 mg/dL — ABNORMAL HIGH (ref 70–99)
Potassium: 4.3 mmol/L (ref 3.5–5.1)
Sodium: 138 mmol/L (ref 135–145)
Total Bilirubin: 0.8 mg/dL (ref 0.3–1.2)
Total Protein: 6 g/dL — ABNORMAL LOW (ref 6.5–8.1)

## 2021-06-15 LAB — PHOSPHORUS: Phosphorus: 3.1 mg/dL (ref 2.5–4.6)

## 2021-06-15 LAB — PROTIME-INR
INR: 1.6 — ABNORMAL HIGH (ref 0.8–1.2)
Prothrombin Time: 19.5 seconds — ABNORMAL HIGH (ref 11.4–15.2)

## 2021-06-15 LAB — MAGNESIUM: Magnesium: 2 mg/dL (ref 1.7–2.4)

## 2021-06-15 MED ORDER — WARFARIN SODIUM 5 MG PO TABS
5.0000 mg | ORAL_TABLET | Freq: Once | ORAL | Status: AC
  Administered 2021-06-15: 5 mg via ORAL
  Filled 2021-06-15: qty 1

## 2021-06-15 NOTE — Progress Notes (Signed)
PT Cancellation Note  Patient Details Name: Edward Arellano MRN: [id number] DOB: October 11, 1951   Cancelled Treatment:    Reason Eval/Treat Not Completed: Patient declined, no reason specified.  Pt refused mobility assessment by PT.  He seems to be perseverating on the fact that he sees things when he closes his eyes and this is preventing him from sleeping.  He got very worked up about it during our conversation demanding I reach out to someone so that they can tell him when it will go away.  I reached out to the neurologist, Dr. Melynda Ripple to relay this conversation.  Pt worked with OT earlier today.  See OT evaluation for details.  PT will re-attempt mobility assessment tomorrow.  Hopefully he will be off of the EEG as well as agreeable to work.   Thanks,  Corinna Capra, PT, DPT  Acute Rehabilitation Ortho Tech Supervisor [phone] pager #[phone] office      [name] 06/15/2021, 3:46 PM

## 2021-06-15 NOTE — Progress Notes (Signed)
PROGRESS NOTE    Edward Arellano  ZOX:[id number] DOB: 1951/05/04 DOA: 06/13/2021 PCP: System, Provider Not In  Brief Narrative:  The patient is a 70 year old obese male with a past medical history significant for but not limited to proximal atrial fibrillation on anticoagulation with Coumadin, history of seizure disorder on Keppra and Vimpat, history of CAD status post CABG, hypertension, diabetes mellitus type 2 that is diet-controlled, history of CVA, history of permanent pacemaker placement as well as other comorbidities who presented to the ED as a code stroke for acute onset of speech deficit.  Originally EMS was called to the patient's home for chest pain.  On arrival patient initially communicated normally but then followed by a sudden onset of receptive and expressive dysphagia along with difficulty following commands as well as left-sided weakness.  Upon arrival to the ED his symptoms resolved and there was no chest pain or shortness of breath.  Neurology evaluated and felt that his differential diagnosis for presentation includes psychiatric/behavioral etiology and acute encephalopathy but did not really favor stroke versus TIA.  Patient had a head CT done which showed no acute intracranial abnormality and his INR was subtherapeutic.  Patient had a CTA of his head and neck was normal and a CT perfusion of his brain which was normal.  Given the low likelihood of stroke the potential benefits of tPA were significantly outweighed by the risks.  Neurology recommended continue home doses of Vimpat 200 mg twice daily and Keppra 1000 mg twice daily however there is patient's for poor intake and twitching and seizure-like activity he is given this IV.  We will obtain an EEG and if this is negative he may benefit from a 24-hour EEG monitoring.  Unfortunately patient's pacemaker is not MRI compatible.  We will continue with neurochecks and appreciate further Neurology reccomendations  Assessment & Plan:    Principal Problem:   Stroke-like symptoms Active Problems:   Persistent atrial fibrillation (HCC)   H/O: stroke   Seizure disorder (HCC)   Pacemaker  Stroke-like symptoms - Transient Speech Disturbance and Confusion -Neurology thinks acute stroke/TIA is possible but less likely and that the transition alteration of awareness and speech disturbance as well as generalized twitching could be a differential for seizure versus sleep disorder -? Acute encephalopathy from subclinical seizure activity? -Neurology recommending video EEG monitoring characterization of spells and holding his Keppra and Vimpat to increase the sensitivity of EEG.  They are recommending continuing Klonopin for anxiety and recommending possible MRI after EEG to look for any intracranial abnormalities however -MRI brain unable to be done given Pacemaker Information at this institution -Cont ASA 81 for now -Continue crestor -Appreciate neurology recommendations patient is now getting LTM. -Patient started having some dysarthria and difficulty speaking and slurred speech.  Neurology is obtaining a speech eval for his dysarthria.  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  No focal consolidation." -Will need an Ambulatory Home O2 screen prior to D/C   Diabetes Mellitus Type 2 -HbA1c was 6.5 -Continue to Monitor  and trend blood Sugars carefully; resume morning CMP's ranging from 103-120 -If Necessary will place on Sensitive Novolog AC HS    Seizure Disorder  -Holding keppra and Vimpat -Seiuzure Precautions -Checking Spot EEG and LTM; EEG was normal and within normal limits -Neurology consulted for further evaluation and recommendations; they are recommending continue to hold his antiepileptics to increase the sensitivity of video EEG for now   Anxiety -Cont Klonopin   Recent cellulitis / 'spider bite'  -Continue Doxycycline BID   Obesity -Complicates overall care and prognosis -Estimated body mass index is 32.38 kg/m as calculated from the following:   Height as of this encounter: 6\' 2"  (1.88 m).   Weight as of this encounter: 114.4 kg. -Weight Loss and Dietary Counseling given    DVT prophylaxis: SCDs Code Status: FULL CODE  Family Communication: No family present at bedside  Disposition Plan: Pending further clinical improvement and clearance by neurology  Status is: Inpatient  Remains inpatient appropriate because:Unsafe d/c plan, IV treatments appropriate due to intensity of illness or inability to take PO, and Inpatient level of care appropriate due to severity of illness  Dispo: The patient is from: Home              Anticipated d/c is to:  TBD              Patient currently is not medically stable to d/c.   Difficult to place patient No  Consultants:  Neurology  Procedures: EEG  Description: The posterior dominant rhythm consists of  7.5 Hz activity of moderate voltage (25-35 uV) seen predominantly in posterior head regions, symmetric and reactive to eye opening and eye closing.  Sleep is characterized by vertex waves, sleep spindles (12 to 14 Hz), maximum frontocentral region.  Hyperventilation and photic stimulation were not performed.      IMPRESSION: This study is within normal limits. No seizures or epileptiform discharges were seen throughout the  recording.  Antimicrobials:  Anti-infectives (From admission, onward)    Start     Dose/Rate Route Frequency Ordered Stop   06/14/21 1000  doxycycline (VIBRA-TABS) tablet 100 mg       Note to Pharmacy: Bid x 10 days     100 mg Oral 2 times daily 06/14/21 06/16/21 06/22/21 2159        Subjective: Seen and examined at bedside and he states his dyspnea on exertion is improved however he started having some slurred speech today and is unclear why.  He denies any chest pain, shortness breath.  No nausea or vomiting.  No other concerns or plans at this time but is concerned about his slurred speech.  PT and OT to further evaluate and treat and he worked with OT but did not work with PT.  No other concerns complaints at this time.  Objective: Vitals:   06/14/21 2326 06/15/21 0432 06/15/21 0805 06/15/21 1144  BP: (!) 155/81 (!) 148/80 (!) 155/90 130/84  Pulse: 68 60 63 62  Resp: 17 18 18 18   Temp: 98.4 F (36.9 C) 98.1 F (36.7 C) 97.7 F (36.5 C) 97.7 F (36.5 C)  TempSrc: Oral Oral Oral Oral  SpO2: 96% 95% 93% 91%  Weight:      Height:        Intake/Output Summary (Last 24 hours) at 06/15/2021 1636 Last data filed at 06/15/2021  1220 Gross per 24 hour  Intake --  Output 1800 ml  Net -1800 ml   Filed Weights   06/13/21 2250 06/13/21 2339  Weight: 114.4 kg 114.4 kg   Examination: Physical Exam:  Constitutional: WN/WD overweight Caucasian male in NAD and appears a little anxious Eyes: Lids and conjunctivae normal, sclerae anicteric  ENMT: External Ears, Nose appear normal. Grossly normal hearing.  Neck: Appears normal, supple, no cervical masses, normal ROM, no appreciable thyromegaly; no JVD Respiratory: Diminished to auscultation bilaterally, no wheezing, rales, rhonchi or crackles. Normal respiratory effort and patient is not tachypenic. No accessory muscle use. Unlabored breathing  Cardiovascular: RRR, no murmurs / rubs / gallops. Trace edema in the LE.  Abdomen: Soft,  non-tender, Distended. Bowel sounds positive.  GU: Deferred. Musculoskeletal: No clubbing / cyanosis of digits/nails. No joint deformity upper and lower extremities.  Skin: No rashes, lesions, ulcers on a limited skin evaluation. No induration; Warm and dry.  Neurologic: CN 2-12 grossly intact with no focal deficits but does have some dysarthria and slurred speech. Romberg sign and cerebellar reflexes not assessed.  Psychiatric: Normal judgment and insight. Alert and oriented x 3. Anxious  mood and appropriate affect.   Data Reviewed: I have personally reviewed following labs and imaging studies  CBC: Recent Labs  Lab 06/13/21 2245 06/13/21 2249 06/14/21 1214 06/15/21 0416  WBC 7.9  --  5.5 7.5  NEUTROABS 4.7  --  3.2 5.0  HGB 16.8 17.0 16.7 16.3  HCT 50.4 50.0 49.6 47.0  MCV 96.2  --  96.1 94.6  PLT 204  --  157 177   Basic Metabolic Panel: Recent Labs  Lab 06/13/21 2245 06/13/21 2249 06/14/21 1214 06/15/21 0416  NA 137 139 138 138  K 4.3 4.2 4.0 4.3  CL 104 103 104 106  CO2 25  --  27 25  GLUCOSE 106* 106* 103* 120*  BUN CREATININE 1.03 1.00 0.92 1.09  CALCIUM 9.0  --  9.0 8.9  MG  --   --  1.9 2.0  PHOS  --   --  2.8 3.1   GFR: Estimated Creatinine Clearance: 82.4 mL/min (by C-G formula based on SCr of 1.09 mg/dL). Liver Function Tests: Recent Labs  Lab 06/13/21 2245 06/14/21 1214 06/15/21 0416  AST 33 28 32  ALT ALKPHOS 95 77 80  BILITOT 0.5 0.6 0.8  PROT 6.5 5.9* 6.0*  ALBUMIN 3.6 3.1* 3.1*   No results for input(s): LIPASE, AMYLASE in the last 168 hours. No results for input(s): AMMONIA in the last 168 hours. Coagulation Profile: Recent Labs  Lab 06/13/21 2245 06/14/21 1214 06/15/21 1008  INR 1.7* 1.7* 1.6*   Cardiac Enzymes: No results for input(s): CKTOTAL, CKMB, CKMBINDEX, TROPONINI in the last 168 hours. BNP (last 3 results) No results for input(s): PROBNP in the last 8760 hours. HbA1C: Recent Labs     06/14/21 0500  HGBA1C 6.5*   CBG: Recent Labs  Lab 06/13/21 2245  GLUCAP 114*   Lipid Profile: Recent Labs    06/14/21 0500  CHOL 142  HDL 43  LDLCALC 72  TRIG 137  CHOLHDL 3.3   Thyroid Function Tests: No results for input(s): TSH, T4TOTAL, FREET4, T3FREE, THYROIDAB in the last 72 hours. Anemia Panel: No results for input(s): VITAMINB12, FOLATE, FERRITIN, TIBC, IRON, RETICCTPCT in the last 72 hours. Sepsis Labs: No results for input(s): PROCALCITON, LATICACIDVEN in the last 168 hours.  Recent Results (from the  past 240 hour(s))  Resp Panel by RT-PCR (Flu A&B, Covid) Nasopharyngeal Swab     Status: None   Collection Time: 06/13/21 11:20 PM   Specimen: Nasopharyngeal Swab; Nasopharyngeal(NP) swabs in vial transport medium  Result Value Ref Range Status   SARS Coronavirus 2 by RT PCR NEGATIVE NEGATIVE Final    Comment: (NOTE) SARS-CoV-2 target nucleic acids are NOT DETECTED.  The SARS-CoV-2 RNA is generally detectable in upper respiratory specimens during the acute phase of infection. The lowest concentration of SARS-CoV-2 viral copies this assay can detect is 138 copies/mL. A negative result does not preclude SARS-Cov-2 infection and should not be used as the sole basis for treatment or other patient management decisions. A negative result may occur with  improper specimen collection/handling, submission of specimen other than nasopharyngeal swab, presence of viral mutation(s) within the areas targeted by this assay, and inadequate number of viral copies(<138 copies/mL). A negative result must be combined with clinical observations, patient history, and epidemiological information. The expected result is Negative.  Fact Sheet for Patients:  BloggerCourse.com  Fact Sheet for Healthcare Providers:  SeriousBroker.it  This test is no t yet approved or cleared by the [place] FDA and  has been authorized for  detection and/or diagnosis of SARS-CoV-2 by FDA under an Emergency Use Authorization (EUA). This EUA will remain  in effect (meaning this test can be used) for the duration of the COVID-19 declaration under Section 564(b)(1) of the Act, 21 U.S.C.section 360bbb-3(b)(1), unless the authorization is terminated  or revoked sooner.       Influenza A by PCR NEGATIVE NEGATIVE Final   Influenza B by PCR NEGATIVE NEGATIVE Final    Comment: (NOTE) The Xpert Xpress SARS-CoV-2/FLU/RSV plus assay is intended as an aid in the diagnosis of influenza from Nasopharyngeal swab specimens and should not be used as a sole basis for treatment. Nasal washings and aspirates are unacceptable for Xpert Xpress SARS-CoV-2/FLU/RSV testing.  Fact Sheet for Patients: BloggerCourse.com  Fact Sheet for Healthcare Providers: SeriousBroker.it  This test is not yet approved or cleared by the [place] FDA and has been authorized for detection and/or diagnosis of SARS-CoV-2 by FDA under an Emergency Use Authorization (EUA). This EUA will remain in effect (meaning this test can be used) for the duration of the COVID-19 declaration under Section 564(b)(1) of the Act, 21 U.S.C. section 360bbb-3(b)(1), unless the authorization is terminated or revoked.  Performed at [facility], [address] [place], [place] [address]     RN Pressure Injury Documentation:     Estimated body mass index is 32.38 kg/m as calculated from the following:   Height as of this encounter: 6\' 2"  (1.88 m).   Weight as of this encounter: 114.4 kg.  Malnutrition Type:   Malnutrition Characteristics:   Nutrition Interventions:    Radiology Studies: DG Chest Portable 1 View  Result Date: 06/13/2021 CLINICAL DATA:  70 year old male with chest pain. EXAM: PORTABLE CHEST 1 VIEW COMPARISON:  Chest radiograph dated 06/13/2021. FINDINGS: There is cardiomegaly with mild vascular  congestion. Minimal bibasilar atelectasis. No focal consolidation, pleural effusion, or pneumothorax. Median sternotomy wires. Left pectoral pacemaker device. No acute osseous pathology. IMPRESSION: Cardiomegaly with mild vascular congestion.  No focal consolidation. Electronically Signed   By: 08/14/2021 M.D.   On: 06/13/2021 23:43   EEG adult  Result Date: 06/14/2021 06/16/2021, MD     06/14/2021  1:12 PM Patient Name: Edward Arellano MRN: [facility] Attending: [id number]  Melynda Ripple Referring Physician/Provider: Dr Lindie Spruce Date: 06/14/2021 Duration: 25.46 mins Patient history: 70 year old history of seizure-like episodes who presented with an episode of twitching and altered awareness.  EEG to evaluate for seizures. Level of alertness: Awake, drowsy AEDs during EEG study: Klonopin, Keppra, Vimpat Technical aspects: This EEG study was done with scalp electrodes positioned according to the 10-20 International system of electrode placement. Electrical activity was acquired at a sampling rate of 500Hz  and reviewed with a high frequency filter of 70Hz  and a low frequency filter of 1Hz . EEG data were recorded continuously and digitally stored. Description: The posterior dominant rhythm consists of  7.5 Hz activity of moderate voltage (25-35 uV) seen predominantly in posterior head regions, symmetric and reactive to eye opening and eye closing. Drowsiness was characterized by attenuation of the posterior background rhythm.  Hyperventilation and photic stimulation were not performed.   IMPRESSION: This study is within normal limits. No seizures or epileptiform discharges were seen throughout the recording. Priyanka   Overnight EEG with video  Result Date: 06/15/2021 , MD     06/15/2021  9:06 AM Patient Name: Edward Arellano MRN: [facility] Attending: 06/17/2021 Referring Physician/Provider: Dr Jerold Coombe Duration: 06/14/2021 1207 to 06/15/2021 0830   Patient history: 70 year old history of seizure-like episodes who presented with an episode of twitching and altered awareness.  EEG to evaluate for seizures.  Level of alertness: Awake, asleep  AEDs during EEG study: Klonopin  Technical aspects: This EEG study was done with scalp electrodes positioned according to the 10-20 International system of electrode placement. Electrical activity was acquired at a sampling rate of 500Hz  and reviewed with a high frequency filter of 70Hz  and a low frequency filter of 1Hz . EEG data were recorded continuously and digitally stored.  Description: The posterior dominant rhythm consists of  7.5 Hz activity of moderate voltage (25-35 uV) seen predominantly in posterior head regions, symmetric and reactive to eye opening and eye closing.  Sleep is characterized by vertex waves, sleep spindles (12 to 14 Hz), maximum frontocentral region.  Hyperventilation and photic stimulation were not performed.    IMPRESSION: This study is within normal limits. No seizures or epileptiform discharges were seen throughout the recording.  1208   ECHOCARDIOGRAM COMPLETE  Result Date: 06/14/2021    ECHOCARDIOGRAM REPORT   Patient Name:   Edward Arellano Date of Exam: 06/14/2021 Medical Rec #:        Height:       74.0 in Accession #:         Weight:       252.2 lb Date of Birth:  [birth year]        BSA:          2.399 m Patient Age:    70 years        BP:           137/71 mmHg Patient Gender: M               HR:           69 bpm. Exam Location:  Inpatient Procedure: 2D Echo, Cardiac Doppler and Color Doppler Indications:    TIA G45.9  History:        Patient has prior history of Echocardiogram examinations, most                 recent 12/22/2016. CAD, Pacemaker, Stroke, Arrythmias:Atrial  Fibrillation; Risk Factors:Hypertension, Diabetes and Sleep                 Apnea. Seizures. GERD.  Sonographer:    Elmarie Shiley Dance Referring Phys: [id number] JARED M GARDNER  IMPRESSIONS  1. Left ventricular ejection fraction, by estimation, is 55%. The left ventricle has normal function. The left ventricle demonstrates regional wall motion abnormalities with septal-lateral dyssynchrony consistent with LBBB. Left ventricular diastolic parameters are consistent with Grade I diastolic dysfunction (impaired relaxation).  2. Right ventricular systolic function is mildly reduced. The right ventricular size is normal. Tricuspid regurgitation signal is inadequate for assessing PA pressure.  3. The aortic valve is tricuspid. Aortic valve regurgitation is not visualized. Mild aortic valve sclerosis is present, with no evidence of aortic valve stenosis.  4. The mitral valve is normal in structure. No evidence of mitral valve regurgitation. No evidence of mitral stenosis.  5. The inferior vena cava is normal in size with greater than 50% respiratory variability, suggesting right atrial pressure of 3 mmHg. FINDINGS  Left Ventricle: Left ventricular ejection fraction, by estimation, is 55%. The left ventricle has normal function. The left ventricle demonstrates regional wall motion abnormalities. The left ventricular internal cavity size was normal in size. There is  no left ventricular hypertrophy. Left ventricular diastolic parameters are consistent with Grade I diastolic dysfunction (impaired relaxation). Right Ventricle: The right ventricular size is normal. No increase in right ventricular wall thickness. Right ventricular systolic function is mildly reduced. Tricuspid regurgitation signal is inadequate for assessing PA pressure. Left Atrium: Left atrial size was normal in size. Right Atrium: Right atrial size was normal in size. Pericardium: There is no evidence of pericardial effusion. Mitral Valve: The mitral valve is normal in structure. No evidence of mitral valve regurgitation. No evidence of mitral valve stenosis. Tricuspid Valve: The tricuspid valve is normal in structure. Tricuspid  valve regurgitation is trivial. Aortic Valve: The aortic valve is tricuspid. Aortic valve regurgitation is not visualized. Mild aortic valve sclerosis is present, with no evidence of aortic valve stenosis. Pulmonic Valve: The pulmonic valve was normal in structure. Pulmonic valve regurgitation is not visualized. Aorta: The aortic root is normal in size and structure. Venous: The inferior vena cava is normal in size with greater than 50% respiratory variability, suggesting right atrial pressure of 3 mmHg. IAS/Shunts: No atrial level shunt detected by color flow Doppler.  LEFT VENTRICLE PLAX 2D LVIDd:         4.20 cm  Diastology LVIDs:         3.60 cm  LV e' medial:    5.97 cm/s LV PW:         1.60 cm  LV E/e' medial:  9.5 LV IVS:        1.00 cm  LV e' lateral:   10.60 cm/s LVOT diam:     2.40 cm  LV E/e' lateral: 5.4 LV SV:         70 LV SV Index:   29 LVOT Area:     4.52 cm  RIGHT VENTRICLE            IVC RV Basal diam:  3.00 cm    IVC diam: 1.80 cm RV S prime:     7.30 cm/s TAPSE (M-mode): 1.5 cm LEFT ATRIUM             Index       RIGHT ATRIUM           Index LA diam:  4.10 cm 1.71 cm/m  RA Area:     15.60 cm LA Vol (A2C):   50.6 ml 21.10 ml/m RA Volume:   35.70 ml  14.88 ml/m LA Vol (A4C):   59.1 ml 24.64 ml/m LA Biplane Vol: 56.3 ml 23.47 ml/m  AORTIC VALVE LVOT Vmax:   75.50 cm/s LVOT Vmean:  53.400 cm/s LVOT VTI:    0.155 m  AORTA Ao Root diam: 3.50 cm Ao Asc diam:  3.10 cm MITRAL VALVE MV Area (PHT): 3.21 cm    SHUNTS MV Decel Time: 236 msec    Systemic VTI:  0.16 m MV E velocity: 57.00 cm/s  Systemic Diam: 2.40 cm MV A velocity: 65.30 cm/s MV E/A ratio:  0.87 Marca Anconaalton Mclean MD Electronically signed by Marca Anconaalton Mclean MD Signature Date/Time: 06/14/2021/3:09:33 PM    Final    CT HEAD CODE STROKE WO CONTRAST  Result Date: 06/13/2021 CLINICAL DATA:  Code stroke.  Slurred speech with left arm weakness EXAM: CT HEAD WITHOUT CONTRAST TECHNIQUE: Contiguous axial images were obtained from the base of  the skull through the vertex without intravenous contrast. COMPARISON:  03/08/2021 FINDINGS: Brain: There is no mass, hemorrhage or extra-axial collection. The size and configuration of the ventricles and extra-axial CSF spaces are normal. There is hypoattenuation of the periventricular white matter, most commonly indicating chronic ischemic microangiopathy. Vascular: No abnormal hyperdensity of the major intracranial arteries or dural venous sinuses. No intracranial atherosclerosis. Skull: The visualized skull base, calvarium and extracranial soft tissues are normal. Sinuses/Orbits: No fluid levels or advanced mucosal thickening of the visualized paranasal sinuses. No mastoid or middle ear effusion. The orbits are normal. ASPECTS [facility]([place] Stroke Program Early CT Score) - Ganglionic level infarction (caudate, lentiform nuclei, internal capsule, insula, M1-M3 cortex): 7 - Supraganglionic infarction (M4-M6 cortex): 3 Total score (0-10 with 10 being normal): 10 IMPRESSION: 1. Chronic ischemic microangiopathy without acute intracranial abnormality. 2. ASPECTS is 10. These results were communicated to Dr. Caryl PinaEric Lindzen at 11:11 pm on 06/13/2021 by text page via the [facility] messaging system. Electronically Signed   By: Deatra RobinsonKevin  Herman M.D.   On: 06/13/2021 23:11   CT ANGIO HEAD NECK W WO CM W PERF (CODE STROKE)  Result Date: 06/13/2021 CLINICAL DATA:  Slurred speech with left-sided weakness EXAM: CT ANGIOGRAPHY HEAD AND NECK CT PERFUSION BRAIN TECHNIQUE: Multidetector CT imaging of the head and neck was performed using the standard protocol during bolus administration of intravenous contrast. Multiplanar CT image reconstructions and MIPs were obtained to evaluate the vascular anatomy. Carotid stenosis measurements (when applicable) are obtained utilizing NASCET criteria, using the distal internal carotid diameter as the denominator. Multiphase CT imaging of the brain was performed following IV bolus contrast injection.  Subsequent parametric perfusion maps were calculated using RAPID software. CONTRAST:  100mL OMNIPAQUE IOHEXOL 350 MG/ML SOLN COMPARISON:  None. FINDINGS: CTA NECK FINDINGS SKELETON: There is no bony spinal canal stenosis. No lytic or blastic lesion. OTHER NECK: Normal pharynx, larynx and major salivary glands. No cervical lymphadenopathy. Unremarkable thyroid gland. UPPER CHEST: No pneumothorax or pleural effusion. No nodules or masses. AORTIC ARCH: There is no calcific atherosclerosis of the aortic arch. There is no aneurysm, dissection or hemodynamically significant stenosis of the visualized portion of the aorta. Conventional 3 vessel aortic branching pattern. The visualized proximal subclavian arteries are widely patent. RIGHT CAROTID SYSTEM: Normal without aneurysm, dissection or stenosis. LEFT CAROTID SYSTEM: Normal without aneurysm, dissection or stenosis. VERTEBRAL ARTERIES: Left dominant configuration. Both origins are clearly patent. There is no  dissection, occlusion or flow-limiting stenosis to the skull base (V1-V3 segments). CTA HEAD FINDINGS POSTERIOR CIRCULATION: --Vertebral arteries: Normal V4 segments. --Inferior cerebellar arteries: Normal. --Basilar artery: Normal. --Superior cerebellar arteries: Normal. --Posterior cerebral arteries (PCA): Normal. ANTERIOR CIRCULATION: --Intracranial internal carotid arteries: Normal. --Anterior cerebral arteries (ACA): Normal. Both A1 segments are present. Patent anterior communicating artery (a-comm). --Middle cerebral arteries (MCA): Normal. VENOUS SINUSES: As permitted by contrast timing, patent. ANATOMIC VARIANTS: None Review of the MIP images confirms the above findings. CT Brain Perfusion Findings: ASPECTS: 10 CBF (<30%) Volume: 0mL Perfusion (Tmax>6.0s) volume: 0mL Mismatch Volume: 0mL Infarction Location:None IMPRESSION: 1. Normal CTA of the head and neck. 2. Normal CT perfusion. Electronically Signed   By: Deatra Robinson M.D.   On: 06/13/2021 23:29     Scheduled Meds:   stroke: mapping our early stages of recovery book   Does not apply Once   aspirin  81 mg Oral Daily   clonazePAM  1 mg Oral BID   doxycycline  100 mg Oral BID   escitalopram  10 mg Oral q morning   magnesium oxide  400 mg Oral Daily   metoprolol succinate  50 mg Oral Daily   mirtazapine  7.5-15 mg Oral QHS   pantoprazole  80 mg Oral Q1200   rosuvastatin  20 mg Oral QPM   triamcinolone ointment  1 application Topical BID   verapamil  40 mg Oral BID   warfarin  5 mg Oral ONCE-1600   Warfarin - Pharmacist Dosing Inpatient   Does not apply q1600   Continuous Infusions:   LOS: 0 days   Merlene Laughter, DO [facility] PAGER is on AMION  If 7PM-7AM, please contact night-coverage [email or web address]

## 2021-06-15 NOTE — Progress Notes (Signed)
EEG maintenance performed.  Reapplied multiple electrodes.  No skin breakdown observed.   Activation procedures performed-photic stim and hyperventilation.

## 2021-06-15 NOTE — Evaluation (Signed)
Occupational Therapy Evaluation Patient Details Name: Edward Arellano MRN: [id number] DOB: 08/29/1951 Today's Date: 06/15/2021    History of Present Illness 70 y.o. male presenting to ED 7/12 as code stroke with acute speech disturbance and L-sided weakness. Symptoms resloved upon arrival to ED. EED (-). Of note, patient with unsafe pacemaker model limiting safe imaging. Further work-up pending. PMHx significant for PAF on coumadin, seizures, CAD s/p CABG, HTN, DMII, Hx of CVA, and pacemaker placement.   Clinical Impression   Pt admitted for concerns above. PTA pt reported that he was independent with all ADL's and IADL's, including higher intensive yard work. Pt presents with continued independence this session, as well as continued safety with all functional mobility. His vision appears normal, however at this time it is diminished due to him not having his glasses. Strength and coordination remain WFL and pt had no difficulties simulating dressing, bathing, and functional mobility in the room. Only assistance provided was for lines and leads. Acute OT will sign off at this time, please re-consult if pt needs change.     Follow Up Recommendations  No OT follow up    Equipment Recommendations  None recommended by OT    Recommendations for Other Services       Precautions / Restrictions Precautions Precautions: Fall Precaution Comments: On continuous EEG for 24 hours Restrictions Weight Bearing Restrictions: No      Mobility Bed Mobility Overal bed mobility: Modified Independent             General bed mobility comments: Pt raised the HOB, otherwise no difficulties getting into and out of bed.    Transfers Overall transfer level: Modified independent Equipment used: None             General transfer comment: PT stood from bed in lowest setting, ambulated to the door andback x2, no difficulties    Balance Overall balance assessment: No apparent balance deficits (not  formally assessed)                                         ADL either performed or assessed with clinical judgement   ADL Overall ADL's : Independent;At baseline                                       General ADL Comments: Pt demonstrated trasnfers this session, LB ADL's, ROM for UB ADL's, stood at sink for simulating grooming, and tolerated being OOB for 8 mins.     Vision Baseline Vision/History: Wears glasses Wears Glasses: At all times Patient Visual Report: No change from baseline Vision Assessment?: No apparent visual deficits Additional Comments: Pt vision is poor without glasses, he reports that he does not have them here with him     Perception Perception Perception Tested?: No   Praxis Praxis Praxis tested?: Not tested    Pertinent Vitals/Pain Pain Assessment: No/denies pain     Hand Dominance Right   Extremity/Trunk Assessment Upper Extremity Assessment Upper Extremity Assessment: Overall WFL for tasks assessed   Lower Extremity Assessment Lower Extremity Assessment: Defer to PT evaluation   Cervical / Trunk Assessment Cervical / Trunk Assessment: Normal   Communication Communication Communication: No difficulties   Cognition Arousal/Alertness: Awake/alert Behavior During Therapy: WFL for tasks assessed/performed Overall Cognitive Status: Within Functional Limits for tasks  assessed                                 General Comments: Pt very talkative this session, clear speech, stays on topic for the most part. Able to remember 3 words after 5 mins.   General Comments  VSS on RA, pt reported past SOB after ambulation, however today appeared WNL.    Exercises     Shoulder Instructions      Home Living Family/patient expects to be discharged to:: Private residence Living Arrangements: Spouse/significant other Available Help at Discharge: Family;Available 24 hours/day Type of Home: House Home Access:  Stairs to enter [employer] of Steps: 2 steps Entrance Stairs-Rails: None Home Layout: One level     Bathroom Shower/Tub: Producer, television/film/video: Handicapped height Bathroom Accessibility: Yes How Accessible: Accessible via walker;Accessible via wheelchair Home Equipment: Dan Humphreys - 2 wheels;Cane - single point          Prior Functioning/Environment Level of Independence: Independent        Comments: Pt reports using no DME        OT Problem List: Decreased strength;Decreased activity tolerance;Decreased cognition;Obesity      OT Treatment/Interventions:      OT Goals(Current goals can be found in the care plan section) Acute Rehab OT Goals Patient Stated Goal: To get his IVIG treatment tomorrow OT Goal Formulation: All assessment and education complete, DC therapy Time For Goal Achievement: 06/15/21 Potential to Achieve Goals: Good  OT Frequency:     Barriers to D/C:            Co-evaluation              AM-PAC OT "6 Clicks" Daily Activity     Outcome Measure Help from another person eating meals?: None Help from another person taking care of personal grooming?: None Help from another person toileting, which includes using toliet, bedpan, or urinal?: None Help from another person bathing (including washing, rinsing, drying)?: None Help from another person to put on and taking off regular upper body clothing?: None Help from another person to put on and taking off regular lower body clothing?: None 6 Click Score: 24   End of Session Nurse Communication: Mobility status  Activity Tolerance: Patient tolerated treatment well Patient left: in bed;with call bell/phone within reach;with bed alarm set  OT Visit Diagnosis: Unsteadiness on feet (R26.81);Muscle weakness (generalized) (M62.81)                Time: 4656-8127 OT Time Calculation (min): 35 min Charges:  OT General Charges $OT Visit: 1 Visit OT Evaluation $OT Eval Low  Complexity: 1 Low OT Treatments $Self Care/Home Management : 8-22 mins  Mishka Stegemann H., OTR/L Acute Rehabilitation  Julianne Chamberlin Elane Camaryn Lumbert 06/15/2021, 9:14 AM

## 2021-06-15 NOTE — Progress Notes (Signed)
Subjective: No acute events overnight.  Patient states he has noticed that his speech sounds more slurred.  Denies any other concerns.  ROS: negative except above  Examination  Vital signs in last 24 hours: Temp:  [97.7 F (36.5 C)-98.4 F (36.9 C)] 97.7 F (36.5 C) (07/14 0805) Pulse Rate:  [54-68] 63 (07/14 0805) Resp:  [7-19] 18 (07/14 0805) BP: (119-162)/(74-92) 155/90 (07/14 0805) SpO2:  [90 %-97 %] 93 % (07/14 0805)  General: lying in bed,  CVS: pulse-normal rate and rhythm RS: breathing comfortably, CTAB Extremities: normal   Neuro: MS: Alert, oriented, follows commands CN: pupils equal and reactive,  EOMI, face symmetric, tongue midline, normal sensation over face Motor: 5/5 strength in all 4 extremities Coordination: FTN intact bilaterally  Basic Metabolic Panel: Recent Labs  Lab 06/13/21 2245 06/13/21 2249 06/14/21 1214 06/15/21 0416  NA 137 139 138 138  K 4.3 4.2 4.0 4.3  CL 104 103 104 106  CO2 25  --  27 25  GLUCOSE 106* 106* 103* 120*  BUN 10 13 10 13   CREATININE 1.03 1.00 0.92 1.09  CALCIUM 9.0  --  9.0 8.9  MG  --   --  1.9 2.0  PHOS  --   --  2.8 3.1    CBC: Recent Labs  Lab 06/13/21 2245 06/13/21 2249 06/14/21 1214 06/15/21 0416  WBC 7.9  --  5.5 7.5  NEUTROABS 4.7  --  3.2 5.0  HGB 16.8 17.0 16.7 16.3  HCT 50.4 50.0 49.6 47.0  MCV 96.2  --  96.1 94.6  PLT 204  --  157 177     Coagulation Studies: Recent Labs    06/13/21 2245 06/14/21 1214 06/15/21 1008  LABPROT 19.6* 20.0* 19.5*  INR 1.7* 1.7* 1.6*    Imaging No new brain imaging overnight.    ASSESSMENT AND PLAN:  70 year old male with episode of transient whole-body twitching associated with speech disturbance and confusion.   Transient alteration of awareness Dysarthria Generalized twitching -Differential for the episode include seizures versus sleep disorder versus TIA -Dysarthria could be secondary to small stroke versus neuromuscular disorder disorder like  myasthenia gravis   Recommendations -Continue video EEG monitoring for characterization of spells -Continue to hold Keppra and Vimpat to increase the sensitivity of video EEG -On Klonopin for anxiety, will continue for now -Unable to obtain MRI brain as pacemaker is incompatible -Patient is unable to provide much history regarding dysarthria.  I will try to obtain more information from wife -We will order speech evaluation for dysarthria -If wife reports waxing and waning dysarthria, will order work-up for myasthenia gravis -Of note, patient is on IVIG for CIDP which was last administered in June 2022 which can also mask symptoms of possible myasthenia gravis -Seizure precautions -As needed IV Ativan 2 mg for clinical seizure-like activity -Management of rest of comorbidities per primary team  I have spent a total of 25  minutes with the patient reviewing hospital notes,  test results, labs and examining the patient as well as establishing an assessment and plan that was discussed personally with the patient, his wife at bedside.  > 50% of time was spent in direct patient care.    July 2022 Epilepsy [facility] For questions after 5pm please refer to AMION to reach the Neurologist on call

## 2021-06-15 NOTE — Progress Notes (Signed)
ANTICOAGULATION CONSULT NOTE - Follow Up Consult  Pharmacy Consult for warfarin Indication: atrial fibrillation  Allergies  Allergen Reactions   Nitroglycerin Palpitations   Cyclobenzaprine Other (See Comments)    Dry mouth   Niacin Other (See Comments)    flushing   Tizanidine Other (See Comments)    Dry mouth   Balsam Rash    Positive Patch Test 05/18/2021   Benzoic Acid Rash    Positive Patch Test 05/18/2021    Patient Measurements: Height: 6\' 2"  (188 cm) Weight: 114.4 kg (252 lb 3.3 oz) IBW/kg (Calculated) : 82.2 Heparin Dosing Weight: 106.2 kg  Vital Signs: Temp: 97.7 F (36.5 C) (07/14 0805) Temp Source: Oral (07/14 0805) BP: 155/90 (07/14 0805) Pulse Rate: 63 (07/14 0805)  Labs: Recent Labs    06/13/21 2245 06/13/21 2249 06/14/21 0500 06/14/21 1214 06/15/21 0416  HGB 16.8 17.0  --  16.7 16.3  HCT 50.4 50.0  --  49.6 47.0  PLT 204  --   --  157 177  APTT 31  --   --   --   --   LABPROT 19.6*  --   --  20.0*  --   INR 1.7*  --   --  1.7*  --   CREATININE 1.03 1.00  --  0.92 1.09  TROPONINIHS  --   --  94* 66*  --     Estimated Creatinine Clearance: 82.4 mL/min (by C-G formula based on SCr of 1.09 mg/dL).   Assessment: 70yo male presents to ED as code stroke, alert was canceled after neuro exam, admitted for further work up. Plan to continue Coumadin for PAF; current INR below goal at 1.7, last dose taken 7/12. Of note per OV notes pt should be taking 3.75mg  daily except 2.5mg  on Mondays but pt and wife report to med hx tech that he takes 3.75mg  daily except 2.5mg  on Mondays and Wednesdays; the dose was changed at appt <49mo ago as it's unclear whether they've been following the new instructions and forgot on interview with tech vs reverting back to old instructions.  Recent INR subtherapeutic at 1.6. Hb stable 16.3 and no bleeding noted. He received a one time dose of 5 mg on admit.  Goal of Therapy:  INR 2-3 Monitor platelets by anticoagulation  protocol: Yes   Plan:  Warfarin 5 mg per nomogram Monitor INR daily for dose adjustments until therapeutic   3mo, PharmD PGY1 Pharmacy Resident 06/15/2021  11:25 AM  Please check [email or web address] for unit-specific pharmacy phone numbers.

## 2021-06-15 NOTE — Procedures (Addendum)
Patient Name: Edward Arellano  MRN: [id number]  Epilepsy Attending: Charlsie Quest  Referring Physician/Provider: Dr Lindie Spruce Duration: 06/14/2021 1207 to 06/15/2021 1207   Patient history: 70 year old history of seizure-like episodes who presented with an episode of twitching and altered awareness.  EEG to evaluate for seizures.   Level of alertness: Awake, asleep   AEDs during EEG study: Klonopin   Technical aspects: This EEG study was done with scalp electrodes positioned according to the 10-20 International system of electrode placement. Electrical activity was acquired at a sampling rate of 500Hz  and reviewed with a high frequency filter of 70Hz  and a low frequency filter of 1Hz . EEG data were recorded continuously and digitally stored.   Description: The posterior dominant rhythm consists of  7.5 Hz activity of moderate voltage (25-35 uV) seen predominantly in posterior head regions, symmetric and reactive to eye opening and eye closing.  Sleep is characterized by vertex waves, sleep spindles (12 to 14 Hz), maximum frontocentral region.  Physiologic photic driving was seen during photic stimulation.  No EEG change was seen during hyperventilation.   IMPRESSION: This study is within normal limits. No seizures or epileptiform discharges were seen throughout the recording.   Danaiya Steadman 

## 2021-06-16 ENCOUNTER — Inpatient Hospital Stay (HOSPITAL_COMMUNITY): Payer: Medicare Other

## 2021-06-16 LAB — URINALYSIS, ROUTINE W REFLEX MICROSCOPIC
Bilirubin Urine: NEGATIVE
Glucose, UA: NEGATIVE mg/dL
Hgb urine dipstick: NEGATIVE
Ketones, ur: NEGATIVE mg/dL
Leukocytes,Ua: NEGATIVE
Nitrite: NEGATIVE
Protein, ur: 30 mg/dL — AB
Specific Gravity, Urine: 1.016 (ref 1.005–1.030)
pH: 5 (ref 5.0–8.0)

## 2021-06-16 LAB — CBC WITH DIFFERENTIAL/PLATELET
Abs Immature Granulocytes: 0.03 10*3/uL (ref 0.00–0.07)
Basophils Absolute: 0.1 10*3/uL (ref 0.0–0.1)
Basophils Relative: 1 %
Eosinophils Absolute: 0.1 10*3/uL (ref 0.0–0.5)
Eosinophils Relative: 1 %
HCT: 54.8 % — ABNORMAL HIGH (ref 39.0–52.0)
Hemoglobin: 18.7 g/dL — ABNORMAL HIGH (ref 13.0–17.0)
Immature Granulocytes: 0 %
Lymphocytes Relative: 18 %
Lymphs Abs: 1.8 10*3/uL (ref 0.7–4.0)
MCH: 32.1 pg (ref 26.0–34.0)
MCHC: 34.1 g/dL (ref 30.0–36.0)
MCV: 94 fL (ref 80.0–100.0)
Monocytes Absolute: 1.1 10*3/uL — ABNORMAL HIGH (ref 0.1–1.0)
Monocytes Relative: 11 %
Neutro Abs: 6.7 10*3/uL (ref 1.7–7.7)
Neutrophils Relative %: 69 %
Platelets: 228 10*3/uL (ref 150–400)
RBC: 5.83 MIL/uL — ABNORMAL HIGH (ref 4.22–5.81)
RDW: 13.2 % (ref 11.5–15.5)
WBC: 9.8 10*3/uL (ref 4.0–10.5)
nRBC: 0 % (ref 0.0–0.2)

## 2021-06-16 LAB — COMPREHENSIVE METABOLIC PANEL
ALT: 28 U/L (ref 0–44)
AST: 37 U/L (ref 15–41)
Albumin: 3.9 g/dL (ref 3.5–5.0)
Alkaline Phosphatase: 92 U/L (ref 38–126)
Anion gap: 8 (ref 5–15)
BUN: 10 mg/dL (ref 8–23)
CO2: 28 mmol/L (ref 22–32)
Calcium: 10 mg/dL (ref 8.9–10.3)
Chloride: 103 mmol/L (ref 98–111)
Creatinine, Ser: 1.03 mg/dL (ref 0.61–1.24)
GFR, Estimated: >60 mL/min (ref >60)
Glucose, Bld: 112 mg/dL — ABNORMAL HIGH (ref 70–99)
Potassium: 3.7 mmol/L (ref 3.5–5.1)
Sodium: 139 mmol/L (ref 135–145)
Total Bilirubin: 0.6 mg/dL (ref 0.3–1.2)
Total Protein: 7.6 g/dL (ref 6.5–8.1)

## 2021-06-16 LAB — PROTIME-INR
INR: 1.8 — ABNORMAL HIGH (ref 0.8–1.2)
Prothrombin Time: 20.9 seconds — ABNORMAL HIGH (ref 11.4–15.2)

## 2021-06-16 LAB — MAGNESIUM: Magnesium: 2 mg/dL (ref 1.7–2.4)

## 2021-06-16 LAB — GLUCOSE, CAPILLARY: Glucose-Capillary: 120 mg/dL — ABNORMAL HIGH (ref 70–99)

## 2021-06-16 LAB — PHOSPHORUS: Phosphorus: 2.5 mg/dL (ref 2.5–4.6)

## 2021-06-16 MED ORDER — LACOSAMIDE 200 MG PO TABS
200.0000 mg | ORAL_TABLET | Freq: Two times a day (BID) | ORAL | Status: DC
  Administered 2021-06-16 – 2021-06-17 (×2): 200 mg via ORAL
  Filled 2021-06-16 (×3): qty 1

## 2021-06-16 MED ORDER — WARFARIN SODIUM 5 MG PO TABS
5.0000 mg | ORAL_TABLET | Freq: Once | ORAL | Status: AC
  Administered 2021-06-16: 5 mg via ORAL
  Filled 2021-06-16: qty 1

## 2021-06-16 MED ORDER — LEVETIRACETAM 500 MG PO TABS
1000.0000 mg | ORAL_TABLET | Freq: Two times a day (BID) | ORAL | Status: DC
  Administered 2021-06-16 – 2021-06-17 (×3): 1000 mg via ORAL
  Filled 2021-06-16 (×4): qty 2

## 2021-06-16 NOTE — Progress Notes (Signed)
ANTICOAGULATION CONSULT NOTE - Follow Up Consult  Pharmacy Consult for warfarin Indication: atrial fibrillation  Allergies  Allergen Reactions   Nitroglycerin Palpitations   Cyclobenzaprine Other (See Comments)    Dry mouth   Niacin Other (See Comments)    flushing   Tizanidine Other (See Comments)    Dry mouth   Balsam Rash    Positive Patch Test 05/18/2021   Benzoic Acid Rash    Positive Patch Test 05/18/2021    Patient Measurements: Height: 6\' 2"  (188 cm) Weight: 114.4 kg (252 lb 3.3 oz) IBW/kg (Calculated) : 82.2 Heparin Dosing Weight: 106.2 kg  Vital Signs: Temp: 97.8 F (36.6 C) (07/15 0802) Temp Source: Oral (07/15 0802) BP: 164/89 (07/15 0802) Pulse Rate: 79 (07/15 0802)  Labs: Recent Labs    06/13/21 2245 06/13/21 2249 06/14/21 0500 06/14/21 1214 06/15/21 0416 06/15/21 1008 06/16/21 0652  HGB 16.8 17.0  --  16.7 16.3  --   --   HCT 50.4 50.0  --  49.6 47.0  --   --   PLT 204  --   --  157 177  --   --   APTT 31  --   --   --   --   --   --   LABPROT 19.6*  --   --  20.0*  --  19.5* 20.9*  INR 1.7*  --   --  1.7*  --  1.6* 1.8*  CREATININE 1.03 1.00  --  0.92 1.09  --   --   TROPONINIHS  --   --  94* 66*  --   --   --      Estimated Creatinine Clearance: 82.4 mL/min (by C-G formula based on SCr of 1.09 mg/dL).   Assessment: 70yo male presents to ED as code stroke, alert was canceled after neuro exam, admitted for further work up. Plan to continue Coumadin for PAF; current INR below goal at 1.7, last dose taken 7/12. Of note per OV notes pt should be taking 3.75mg  daily except 2.5mg  on Mondays but pt and wife report to med hx tech that he takes 3.75mg  daily except 2.5mg  on Mondays and Wednesdays; the dose was changed at appt <52mo ago as it's unclear whether they've been following the new instructions and forgot on interview with tech vs reverting back to old instructions.  Recent INR subtherapeutic at 1.6. Hb stable 16.3 and no bleeding noted. He has  received 5mg  daily x2 days so far, INR 1.8 today.  Goal of Therapy:  INR 2-3 Monitor platelets by anticoagulation protocol: Yes   Plan:  Warfarin 5 mg again today given still subtherapeutic, likely will anticipate a dose decrease tomorrow to 2.5mg  in hopes INR becomes therapeutic and to avoid overshooting. -Monitor INR daily for dose adjustments until therapeutic.  3mo, PharmD, [facility] Emergency Medicine Clinical Pharmacist ED RPh Phone: [phone] Main RX: [phone]

## 2021-06-16 NOTE — Progress Notes (Signed)
Urine sample obtained and sent to lab.

## 2021-06-16 NOTE — Procedures (Addendum)
Patient Name: Edward Arellano  MRN: [id number]  Epilepsy Attending: Charlsie Quest  Referring Physician/Provider: Dr Lindie Spruce Duration: 06/15/2021 1207 to 06/16/2021 1235   Patient history: 70 year old history of seizure-like episodes who presented with an episode of twitching and altered awareness.  EEG to evaluate for seizures.   Level of alertness: Awake, asleep   AEDs during EEG study: Klonopin   Technical aspects: This EEG study was done with scalp electrodes positioned according to the 10-20 International system of electrode placement. Electrical activity was acquired at a sampling rate of 500Hz  and reviewed with a high frequency filter of 70Hz  and a low frequency filter of 1Hz . EEG data were recorded continuously and digitally stored.   Description: The posterior dominant rhythm consists of  7.5 Hz activity of moderate voltage (25-35 uV) seen predominantly in posterior head regions, symmetric and reactive to eye opening and eye closing.  Sleep is characterized by vertex waves, sleep spindles (12 to 14 Hz), maximum frontocentral region.     IMPRESSION: This study is within normal limits. No seizures or epileptiform discharges were seen throughout the recording.   Guled Gahan 

## 2021-06-16 NOTE — Progress Notes (Signed)
PROGRESS NOTE    Edward Arellano  XIP:[id number] DOB: Jul 04, 1951 DOA: 06/13/2021 PCP: System, Provider Not In  Brief Narrative:  The patient is a 70 year old obese male with a past medical history significant for but not limited to proximal atrial fibrillation on anticoagulation with Coumadin, history of seizure disorder on Keppra and Vimpat, history of CAD status post CABG, hypertension, diabetes mellitus type 2 that is diet-controlled, history of CVA, history of permanent pacemaker placement as well as other comorbidities who presented to the ED as a code stroke for acute onset of speech deficit.  Originally EMS was called to the patient's home for chest pain.  On arrival patient initially communicated normally but then followed by a sudden onset of receptive and expressive dysphagia along with difficulty following commands as well as left-sided weakness.  Upon arrival to the ED his symptoms resolved and there was no chest pain or shortness of breath.  Neurology evaluated and felt that his differential diagnosis for presentation includes psychiatric/behavioral etiology and acute encephalopathy but did not really favor stroke versus TIA.  Patient had a head CT done which showed no acute intracranial abnormality and his INR was subtherapeutic.  Patient had a CTA of his head and neck was normal and a CT perfusion of his brain which was normal.  Given the low likelihood of stroke the potential benefits of tPA were significantly outweighed by the risks.  Neurology recommended continue home doses of Vimpat 200 mg twice daily and Keppra 1000 mg twice daily however there is patient's for poor intake and twitching and seizure-like activity he is given this IV.  We will obtain an EEG and if this is negative he may benefit from a 24-hour EEG monitoring.  Unfortunately patient's pacemaker is not MRI compatible.  We will continue with neurochecks and appreciate further Neurology reccomendations.  Neurology is further  evaluated and had an LTM EEG for 48 hours after stopping his Keppra and Vimpat and this was normal.  Neurology discussed about increasing Vimpat dose but patient prefers to remain on same.  He also had some slurred speech is intermittent and because we cannot do an MRI we will repeat a head CT and it did not show any acute stroke.  Just in case neurology checking acetylcholine antibodies to rule out myasthenia gravis.  Patient continued to have some tremors that visual hallucinations with closing his eyes and neurology feels that he may have some delirium versus dementia.  We will going to check a UA and SLP is still ordered and pending to be done given his slurred speech is been intermittent.  Assessment & Plan:   Principal Problem:   Stroke-like symptoms Active Problems:   Persistent atrial fibrillation (HCC)   H/O: stroke   Seizure disorder (HCC)   Pacemaker  Stroke-like symptoms - Transient Speech Disturbance, visual hallucinations and Confusion -Neurology thinks acute stroke/TIA is possible but less likely and that the transition alteration of awareness and speech disturbance as well as generalized twitching could be a differential for seizure versus sleep disorder -? Acute encephalopathy from subclinical seizure activity? -Neurology recommending video EEG monitoring characterization of spells and holding his Keppra and Vimpat to increase the sensitivity of EEG.  They are recommending continuing Klonopin for anxiety and recommending possible MRI after EEG to look for any intracranial abnormalities however MRI brain unable to be done given Pacemaker Information at this institution -Cont ASA 81 for now -Continue crestor -Appreciate neurology recommendations patient is now getting LTM. -Patient started having some  dysarthria and difficulty speaking and slurred speech.  He was also having some transient visual disturbances when he closes eyes.  Neurology is obtaining a speech eval for his  dysarthria.  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 Mild atrophy and mild chronic microvascular ischemic change in the white matter." -Neurology feels that the patient may have some early dementia but think that his episodes were likely related to delirium and not sleeping -Neurology recommends checking a UA as well as a speech evaluation if these are normal patient can likely be discharged from neurological standpoint -PT and OT evaluated recommending no PT Follow up    A.Fib  -Continue Verapamil 40 mg po BID -Cont Metoprolol Succinate 50 mg po Daily  -Continue coumadin per pharm; last PT/INR was 19.5/1.6 -Continue to Monitor on Telemetry   Dyspnea on Exertion -Check ECHO and BNP; BNP was 70.7 -Troponin was 94 and titrated down to 66 -Repeat CXR in the Am as initial CXR showed "Cardiomegaly with mild vascular congestion.  No focal consolidation." -Will need an Ambulatory Home O2 screen prior to D/C and have ordered one for the AM   Diabetes Mellitus Type 2 -HbA1c was 6.5 -Continue to Monitor and trend blood Sugars carefully; resume morning CMP's ranging from 103-120 -If Necessary will place on Sensitive Novolog AC HS    Seizure Disorder  -Was Holding keppra and Vimpat but now resumed by Neurology  -Seiuzure Precautions -Checking Spot EEG and LTM; EEG was normal and within normal limits -Neurology consulted for further evaluation and recommendations; they are recommending continue to hold his antiepileptics to increase the sensitivity of video EEG for now but have now discontinued  EEG monitoring and resumed Home Meds    Anxiety -Continue Clonazepam 1 mg po BID    Recent cellulitis / 'spider bite'  -Continue Doxycycline 100 mg po BID   Obesity -Complicates overall care and prognosis -Estimated body mass index is 32.38 kg/m as calculated from the following:   Height as of this encounter: 6\' 2"  (1.88 m).   Weight as of this encounter: 114.4 kg. -Weight Loss and Dietary Counseling given    DVT prophylaxis: SCDs Code Status: FULL CODE  Family Communication: No family present at bedside  Disposition Plan: Pending further clinical improvement and clearance by neurology  Status is: Inpatient  Remains inpatient appropriate because:Unsafe d/c plan, IV treatments appropriate due to intensity of illness or inability to take PO, and Inpatient level of care appropriate due to severity of illness  Dispo: The patient is from: Home              Anticipated d/c is to:  TBD              Patient currently is not medically stable to d/c.   Difficult to place patient No  Consultants:  Neurology  Procedures: EEG  Description: The posterior dominant rhythm consists of  7.5 Hz activity of moderate voltage (25-35 uV) seen predominantly in posterior head regions, symmetric and reactive to eye opening and eye closing.  Sleep is characterized by vertex waves, sleep spindles (12 to  14 Hz), maximum frontocentral region.  Hyperventilation and photic stimulation were not performed.      IMPRESSION: This study is within normal limits. No seizures or epileptiform discharges were seen throughout the recording.  Antimicrobials:  Anti-infectives (From admission, onward)    Start     Dose/Rate Route Frequency Ordered Stop   06/14/21 1000  doxycycline (VIBRA-TABS) tablet 100 mg       Note to Pharmacy: Bid x 10 days     100 mg Oral 2 times daily 06/14/21 [id number] 06/22/21 2159        Subjective: Seen and examined at bedside and states that the feeling of when he closes his eyes and  sees people has stopped.  Got a little bit asleep.  No nausea or vomiting.  Continues to have some intermittent slurred speech.  No chest pain or shortness of breath.  Feels much better today.  No other concerns or complaints at this time  Objective: Vitals:   06/15/21 2338 06/16/21 0523 06/16/21 0802 06/16/21 1153  BP: (!) 167/91 (!) 166/89 (!) 164/89 (!) 164/95  Pulse: 65 75 79 70  Resp: 18 18 16 16   Temp: 97.9 F (36.6 C) 97.7 F (36.5 C) 97.8 F (36.6 C) 98.4 F (36.9 C)  TempSrc: Oral Oral Oral Oral  SpO2: 94% 93% 95% 94%  Weight:      Height:        Intake/Output Summary (Last 24 hours) at 06/16/2021 1601 Last data filed at 06/16/2021 0830 Gross per 24 hour  Intake 480 ml  Output 900 ml  Net -420 ml    Filed Weights   06/13/21 2250 06/13/21 2339  Weight: 114.4 kg 114.4 kg   Examination: Physical Exam:  Constitutional: WN/WD obese Caucasian male currently in no acute distress appears however anxious again Eyes: Lids and conjunctivae normal, sclerae anicteric  ENMT: External Ears, Nose appear normal. Grossly normal hearing.  Neck: Appears normal, supple, no cervical masses, normal ROM, no appreciable thyromegaly; no JVD Respiratory: Diminished to auscultation bilaterally, no wheezing, rales, rhonchi or crackles. Normal respiratory effort and patient is not tachypenic. No accessory muscle use.  Unlabored breathing Cardiovascular: RRR, no murmurs / rubs / gallops. S1 and S2 auscultated.  Trace extremity edema in the lower extremities Abdomen: Soft, non-tender, distended secondary body habitus.  Bowel sounds positive.  GU: Deferred. Musculoskeletal: No clubbing / cyanosis of digits/nails. No joint deformity upper and lower extremities.  Skin: No rashes, lesions, ulcers on limited skin evaluation. No induration; Warm and dry.  Neurologic: CN 2-12 grossly intact with no focal deficits.  Romberg sign cerebellar reflexes not assessed.  Psychiatric: Normal judgment and insight.  Alert and oriented x 3.  Mildly anxious mood and appropriate affect.   Data Reviewed: I have personally reviewed following labs and imaging studies  CBC: Recent Labs  Lab 06/13/21 2245 06/13/21 2249 06/14/21 1214 06/15/21 0416 06/16/21 0939  WBC 7.9  --  5.5 7.5 9.8  NEUTROABS 4.7  --  3.2 5.0 6.7  HGB 16.8 17.0 16.7 16.3 18.7*  HCT 50.4 50.0 49.6 47.0 54.8*  MCV 96.2  --  96.1 94.6 94.0  PLT 204  --  157 177 228    Basic Metabolic Panel: Recent Labs  Lab 06/13/21 2245 06/13/21 2249 06/14/21 1214 06/15/21 0416 06/16/21 0939  NA 137 139 138 138 139  K 4.3 4.2 4.0 4.3 3.7  CL 104 103 104 106 103  CO2 25  --  27 25 28   GLUCOSE 106* 106*  103* 120* 112*  BUN 10 13 10 13 10   CREATININE 1.03 1.00 0.92 1.09 1.03  CALCIUM 9.0  --  9.0 8.9 10.0  MG  --   --  1.9 2.0 2.0  PHOS  --   --  2.8 3.1 2.5    GFR: Estimated Creatinine Clearance: 87.2 mL/min (by C-G formula based on SCr of 1.03 mg/dL). Liver Function Tests: Recent Labs  Lab 06/13/21 2245 06/14/21 1214 06/15/21 0416 06/16/21 0939  AST 33 28 32 37  ALT 23 23 20 28   ALKPHOS 95 77 80 92  BILITOT 0.5 0.6 0.8 0.6  PROT 6.5 5.9* 6.0* 7.6  ALBUMIN 3.6 3.1* 3.1* 3.9    No results for input(s): LIPASE, AMYLASE in the last 168 hours. No results for input(s): AMMONIA in the last 168 hours. Coagulation Profile: Recent Labs  Lab 06/13/21 2245 06/14/21 1214 06/15/21 1008 06/16/21 0652  INR 1.7* 1.7* 1.6* 1.8*    Cardiac Enzymes: No results for input(s): CKTOTAL, CKMB, CKMBINDEX, TROPONINI in the last 168 hours. BNP (last 3 results) No results for input(s): PROBNP in the last 8760 hours. HbA1C: Recent Labs    06/14/21 0500  HGBA1C 6.5*    CBG: Recent Labs  Lab 06/13/21 2245 06/16/21 1151  GLUCAP 114* 120*    Lipid Profile: Recent Labs    06/14/21 0500  CHOL 142  HDL 43  LDLCALC 72  TRIG 137  CHOLHDL 3.3    Thyroid Function Tests: No results for input(s): TSH, T4TOTAL, FREET4, T3FREE,  THYROIDAB in the last 72 hours. Anemia Panel: No results for input(s): VITAMINB12, FOLATE, FERRITIN, TIBC, IRON, RETICCTPCT in the last 72 hours. Sepsis Labs: No results for input(s): PROCALCITON, LATICACIDVEN in the last 168 hours.  Recent Results (from the past 240 hour(s))  Resp Panel by RT-PCR (Flu A&B, Covid) Nasopharyngeal Swab     Status: None   Collection Time: 06/13/21 11:20 PM   Specimen: Nasopharyngeal Swab; Nasopharyngeal(NP) swabs in vial transport medium  Result Value Ref Range Status   SARS Coronavirus 2 by RT PCR NEGATIVE NEGATIVE Final    Comment: (NOTE) SARS-CoV-2 target nucleic acids are NOT DETECTED.  The SARS-CoV-2 RNA is generally detectable in upper respiratory specimens during the acute phase of infection. The lowest concentration of SARS-CoV-2 viral copies this assay can detect is 138 copies/mL. A negative result does not preclude SARS-Cov-2 infection and should not be used as the sole basis for treatment or other patient management decisions. A negative result may occur with  improper specimen collection/handling, submission of specimen other than nasopharyngeal swab, presence of viral mutation(s) within the areas targeted by this assay, and inadequate number of viral copies(<138 copies/mL). A negative result must be combined with clinical observations, patient history, and epidemiological information. The expected result is Negative.  Fact Sheet for Patients:  06/16/21  Fact Sheet for Healthcare Providers:  08/14/21  This test is no t yet approved or cleared by the [employer] FDA and  has been authorized for detection and/or diagnosis of SARS-CoV-2 by FDA under an Emergency Use Authorization (EUA). This EUA will remain  in effect (meaning this test can be used) for the duration of the COVID-19 declaration under Section 564(b)(1) of the Act, 21 U.S.C.section 360bbb-3(b)(1), unless the  authorization is terminated  or revoked sooner.       Influenza A by PCR NEGATIVE NEGATIVE Final   Influenza B by PCR NEGATIVE NEGATIVE Final    Comment: (NOTE) The Xpert Xpress SARS-CoV-2/FLU/RSV plus assay is  intended as an aid in the diagnosis of influenza from Nasopharyngeal swab specimens and should not be used as a sole basis for treatment. Nasal washings and aspirates are unacceptable for Xpert Xpress SARS-CoV-2/FLU/RSV testing.  Fact Sheet for Patients: [employer]  Fact Sheet for Healthcare Providers: [employer]ious[employer]ker[employer]it  This test is not yet approved or cleared by the [place] FDA and has been authorized for detection and/or diagnosis of SARS-CoV-2 by FDA under an Emergency Use Authorization (EUA). This EUA will remain in effect (meaning this test can be used) for the duration of the COVID-19 declaration under Section 564(b)(1) of the Act, 21 U.S.C. section 360bbb-3(b)(1), unless the authorization is terminated or revoked.  Performed at [facility], [address], [place], [place] [address]      RN Pressure Injury Documentation:     Estimated body mass index is 32.38 kg/m as calculated from the following:   Height as of this encounter:  (1.88 m).   Weight as of this encounter: 114.4 kg.  Malnutrition Type:   Malnutrition Characteristics:   Nutrition Interventions:    Radiology Studies: CT HEAD WO CONTRAST  Result Date: 06/16/2021 CLINICAL DATA:  Stroke follow-up. Speech disturbance and left-sided weakness EXAM: CT HEAD WITHOUT CONTRAST TECHNIQUE: Contiguous axial images were obtained from the base of the skull through the vertex without intravenous contrast. COMPARISON:  CT head 06/13/2021 FINDINGS: Brain: No evidence of acute infarction, hemorrhage, hydrocephalus, extra-axial collection or mass lesion/mass effect. Mild atrophy and mild periventricular white matter hypodensity  bilaterally. Vascular: Negative for hyperdense vessel Skull: Negative Sinuses/Orbits: Paranasal sinuses clear. Bilateral cataract extraction Other: None IMPRESSION: No acute abnormality. Mild atrophy and mild chronic microvascular ischemic change in the white matter. Electronically Signed   By: Marlan Palau M.D.   On: 06/16/2021 15:54   Overnight EEG with video  Result Date: 06/15/2021 Charlsie Quest, MD     06/16/2021  8:55 AM Patient Name: Edward Arellano MRN: [id number] Epilepsy Attending: Charlsie Quest Referring Physician/Provider: Dr Lindie Spruce Duration: 06/14/2021 1207 to 06/15/2021 1207  Patient history: 70 year old history of seizure-like episodes who presented with an episode of twitching and altered awareness.  EEG to evaluate for seizures.  Level of alertness: Awake, asleep  AEDs during EEG study: Klonopin  Technical aspects: This EEG study was done with scalp electrodes positioned according to the 10-20 International system of electrode placement. Electrical activity was acquired at a sampling rate of  and reviewed with a high frequency filter of  and a low frequency filter of . EEG data were recorded continuously and digitally stored.  Description: The posterior dominant rhythm consists of  7.5 Hz activity of moderate voltage (25-35 uV) seen predominantly in posterior head regions, symmetric and reactive to eye opening and eye closing.  Sleep is characterized by vertex waves, sleep spindles (12 to 14 Hz), maximum frontocentral region.  Physiologic photic driving was seen during photic stimulation.  No EEG change was seen during hyperventilation.  IMPRESSION: This study is within normal limits. No seizures or epileptiform discharges were seen throughout the recording.  Priyanka Annabelle Harman    Scheduled Meds:   stroke: mapping our early stages of recovery book   Does not apply Once   aspirin  81 mg Oral Daily   clonazePAM  1 mg Oral BID   doxycycline  100 mg Oral BID    escitalopram  10 mg Oral q morning   lacosamide  200 mg Oral BID   levETIRAcetam  1,000 mg  Oral BID   magnesium oxide  400 mg Oral Daily   metoprolol succinate  50 mg Oral Daily   mirtazapine  7.5-15 mg Oral QHS   pantoprazole  80 mg Oral Q1200   rosuvastatin  20 mg Oral QPM   triamcinolone ointment  1 application Topical BID   verapamil  40 mg Oral BID   warfarin  5 mg Oral ONCE-1600   Warfarin - Pharmacist Dosing Inpatient   Does not apply q1600   Continuous Infusions:   LOS: 1 day   Merlene Laughter, DO [facility] PAGER is on AMION  If 7PM-7AM, please contact night-coverage [email or web address]

## 2021-06-16 NOTE — Progress Notes (Addendum)
Subjective: No seizure like episodes overnight. Patient reported he was seeing people doing inappropriate stuff everytime he closed is eyes yesterday afternoon and evening. He denied  seeing anyone when he would open his eyes. States he knew those people were not real but it was still bothering him and he couldnt close his eyes to rest for a long time. Eventually he tried to fall asleep and this morning since he woke up he hasnt seen any people on closing his eyes. Denies having similar symptoms in past. Also states his speech is better today.   I called and spoke with his wife as well. She states he has never had similar symptoms in past. She also states he has  severe anxiety and depression and has had trouble sleeping since being inpatient except last night. As for his speech, wife states he has intermittent slurred speech and she thinks its because he doesn't try to speak properly. She denies patient ever reporting diplopia, proximal weakness, ptosis, trouble breathing. Does report leg weakness  for days to weeks prior to his next scheduled dose of IVIG  Denies history if dementia/ excessive forgetfulness, recent infection   ROS: negative except above  Examination  Vital signs in last 24 hours: Temp:  [97.6 F (36.4 C)-98.4 F (36.9 C)] 98.4 F (36.9 C) (07/15 1153) Pulse Rate:  [62-79] 70 (07/15 1153) Resp:  [16-19] 16 (07/15 1153) BP: (164-171)/(88-97) 164/95 (07/15 1153) SpO2:  [92 %-95 %] 94 % (07/15 1153)  General: lying in bed, NAD CVS: pulse-normal rate and rhythm RS: breathing comfortably, CTAB Extremities: normal, warm     Neuro: MS: Alert, oriented, follows commands CN: pupils equal and reactive,  EOMI, face symmetric, tongue midline, normal sensation over face Motor: 5/5 strength in all 4 extremities Coordination: FTN intact bilaterally    Basic Metabolic Panel: Recent Labs  Lab 06/13/21 2245 06/13/21 2249 06/14/21 1214 06/15/21 0416 06/16/21 0939  NA 137 139  138 138 139  K 4.3 4.2 4.0 4.3 3.7  CL 104 103 104 106 103  CO2 25  --  27 25 28   GLUCOSE 106* 106* 103* 120* 112*  BUN 10 13 10 13 10   CREATININE 1.03 1.00 0.92 1.09 1.03  CALCIUM 9.0  --  9.0 8.9 10.0  MG  --   --  1.9 2.0 2.0  PHOS  --   --  2.8 3.1 2.5    CBC: Recent Labs  Lab 06/13/21 2245 06/13/21 2249 06/14/21 1214 06/15/21 0416 06/16/21 0939  WBC 7.9  --  5.5 7.5 9.8  NEUTROABS 4.7  --  3.2 5.0 6.7  HGB 16.8 17.0 16.7 16.3 18.7*  HCT 50.4 50.0 49.6 47.0 54.8*  MCV 96.2  --  96.1 94.6 94.0  PLT 204  --  157 177 228     Coagulation Studies: Recent Labs    06/13/21 2245 06/14/21 1214 06/15/21 1008 06/16/21 0652  LABPROT 19.6* 20.0* 19.5* 20.9*  INR 1.7* 1.7* 1.6* 1.8*    Imaging Repeat CT head ordered and pending  ASSESSMENT AND PLAN:  70 year old male with episode of transient whole-body twitching associated with speech disturbance and confusion.   Transient alteration of awareness Dysarthria Generalized twitching Transient visual hallucinations Delirium - Patient has been on keppra and vimpat for suspected seizure. However, has never had GTC seizure, has had multiple normal EEGs. Per wife, he has had an episode of speech disturbance with some twitching in past and then had a similar episode on admission while in ED.  The episode in ED during this admission was described as eye open and staring, arms and leg twitching, not responding for less than a minute. Patient was asleep just prior to the episode. LTM EEG for 48 hours after stopping keppra and vimpat and with HV and photic for seizure provocations was within normal limits. Even though I cannot completely exclude epilepsy, the semiology as described by wife, frequency of these episodes ( twice so far and multiple years apart without clear provoking factors, normal EEG) makes epilepsy less likely. I discussed reducing vimpat dose ( this has also been discussed by his primary neurologist in past). But patient  states he would prefer to continue same dose for now. - He also reported slurred speech which was intermittent in nature but no clear provoking/worsening factors. Unfortunately MRI brain couldn't be obtained due to MRI incompatible pacemaker. CT head didn't show any acute stroke. Ddx include TIA vs encephalopathy due to infection vs neuromuscular disorder/myesthenia. He doesn't have any other symptoms of myesthenia gravis. He also receives IVIG infusion for CIDP which could be masking some symptoms. Although low suspicion, will check acetylcholine antibodies  - Patient reported transient visual hallucination on closing his eyes which resolved after he was able to fall asleep. This is atypical presentation but could be due to delirium ( elderly male with lack of sleep in unfamiliar environment), Lewy body dementia vs primary psychiatric disorder. It has since resolved so will avoid any further workup for now. If this recurs,will likely need more workup for dementia - Lastly, will check UA to rule out infection as cause of transient dysarthria, confusion - Speech eval has been ordered and pending as well -Management of rest of comorbidities per primary team - F/u with primary neurologst Dr Norlene Campbell  at [facility] in 10-12 weeks   I have spent a total of 35  minutes with the patient reviewing hospital notes,  test results, labs and examining the patient as well as establishing an assessment and plan that was discussed personally with the patient, his wife at bedside.  > 50% of time was spent in direct patient care.   Lindie Spruce Epilepsy [facility] For questions after 5pm please refer to AMION to reach the Neurologist on call

## 2021-06-16 NOTE — Progress Notes (Signed)
LTM EEG discontinued - broken skin sore at f7; RN notified

## 2021-06-16 NOTE — Progress Notes (Signed)
LTM maintenance completed; no skin breakdown was seen. 

## 2021-06-16 NOTE — Evaluation (Signed)
Physical Therapy Evaluation Patient Details Name: Edward Arellano MRN: [id number] DOB: 04/08/1951 Today's Date: 06/16/2021   History of Present Illness  70 y.o. male presenting to ED 7/12 as code stroke with acute speech disturbance and L-sided weakness. Symptoms resloved upon arrival to ED. EED (-). Of note, patient with unsafe pacemaker model limiting safe imaging. Further work-up pending. PMHx significant for PAF on coumadin, seizures, CAD s/p CABG, HTN, DMII, Hx of CVA, and pacemaker placement.  Clinical Impression  PTA pt living with wife in single story home with 3 steps to enter. Pt reports independence with mobility, ADLs, mows lawn and drives. Pt is anxious and restless on entry, still has EED leads on and is complaining of headache. Pt speech is slurred and he is having difficulty with word finding. Pt is also not truly oriented to situation. Pt is supervision for bed mobility and min guard for transfers and ambulation with RW. Pt will not need any additional PT needs at discharge, however PT will continue to follow acutely to progress mobility.     Follow Up Recommendations No PT follow up;Supervision - Intermittent    Equipment Recommendations  None recommended by PT (has necessary equipment)       Precautions / Restrictions Precautions Precautions: Fall Precaution Comments: EEG wires Restrictions Weight Bearing Restrictions: No      Mobility  Bed Mobility Overal bed mobility: Needs Assistance Bed Mobility: Supine to Sit     Supine to sit: Supervision     General bed mobility comments: supervision for safety, increased time and effort needed    Transfers Overall transfer level: Needs assistance Equipment used: Rolling walker (2 wheeled) Transfers: Sit to/from Stand Sit to Stand: Min guard         General transfer comment: min guard for safety, able to static stand prior to reachinf for RW  Ambulation/Gait Ambulation/Gait assistance: Min guard Gait Distance  (Feet): 16 Feet Assistive device: Rolling walker (2 wheeled) Gait Pattern/deviations: Step-through pattern;Decreased step length - right;Decreased step length - left;Shuffle Gait velocity: slowed Gait velocity interpretation: <1.31 ft/sec, indicative of household ambulator General Gait Details: min guard for safety, ambulation distance limited due to EEG leads still in place, slow shuffling gait        Balance Overall balance assessment: No apparent balance deficits (not formally assessed)                                           Pertinent Vitals/Pain Pain Assessment: Faces Faces Pain Scale: Hurts a little bit Pain Location: headache Pain Descriptors / Indicators: Headache Pain Intervention(s): Monitored during session;Repositioned;Premedicated before session    Home Living Family/patient expects to be discharged to:: Private residence Living Arrangements: Spouse/significant other Available Help at Discharge: Family;Available 24 hours/day Type of Home: House Home Access: Stairs to enter Entrance Stairs-Rails: None Entrance Stairs-Number of Steps: 2 steps Home Layout: One level Home Equipment: Walker - 2 wheels;Cane - single point      Prior Function Level of Independence: Independent         Comments: Pt reports using no DME     Hand Dominance   Dominant Hand: Right    Extremity/Trunk Assessment   Upper Extremity Assessment Upper Extremity Assessment: Defer to OT evaluation    Lower Extremity Assessment Lower Extremity Assessment: Generalized weakness    Cervical / Trunk Assessment Cervical / Trunk Assessment: Normal  Communication  Communication: Expressive difficulties (slurred speech, difficulty with word finding)  Cognition Arousal/Alertness: Awake/alert Behavior During Therapy: Agitated;Restless Overall Cognitive Status: No family/caregiver present to determine baseline cognitive functioning Area of Impairment:  Safety/judgement;Awareness;Orientation;Memory;Following commands;Problem solving                 Orientation Level: Disoriented to;Situation (having difficulty remembering why he is here and then when he does he can only remember it starts with "s")   Memory: Decreased short-term memory (can not remember why he is having EEg) Following Commands: Follows one step commands inconsistently;Follows multi-step commands inconsistently;Follows one step commands with increased time;Follows multi-step commands with increased time Safety/Judgement: Decreased awareness of deficits Awareness: Emergent Problem Solving: Slow processing;Difficulty sequencing;Requires verbal cues;Requires tactile cues General Comments: pt very anxious with EEG leads still on, thinks that he hears his wife in hallway when she is not in the hospital, reports he has not slept in 3 days      General Comments General comments (skin integrity, edema, etc.): VSS on RA pt reports he has not slept in 3 days        Assessment/Plan    PT Assessment Patient needs continued PT services  PT Problem List Decreased strength;Decreased mobility;Decreased cognition       PT Treatment Interventions DME instruction;Gait training;Stair training;Functional mobility training;Therapeutic activities;Therapeutic exercise;Balance training;Cognitive remediation;Patient/family education    PT Goals (Current goals can be found in the Care Plan section)  Acute Rehab PT Goals Patient Stated Goal: To get his IVIG treatment tomorrow PT Goal Formulation: With patient Time For Goal Achievement: 06/30/21 Potential to Achieve Goals: Good    Frequency Min 3X/week    AM-PAC PT "6 Clicks" Mobility  Outcome Measure Help needed turning from your back to your side while in a flat bed without using bedrails?: None Help needed moving from lying on your back to sitting on the side of a flat bed without using bedrails?: None Help needed moving to and  from a bed to a chair (including a wheelchair)?: None Help needed standing up from a chair using your arms (e.g., wheelchair or bedside chair)?: None Help needed to walk in hospital room?: A Little Help needed climbing 3-5 steps with a railing? : A Little 6 Click Score: 22    End of Session Equipment Utilized During Treatment: Gait belt Activity Tolerance: Patient tolerated treatment well Patient left: in chair;with call bell/phone within reach Nurse Communication: Mobility status;Other (comment) (feels better sitting up in chair) PT Visit Diagnosis: Muscle weakness (generalized) (M62.81);Pain;Other symptoms and signs involving the nervous system (R29.898) Pain - part of body:  (headache)    Time: 1700-1749 PT Time Calculation (min) (ACUTE ONLY): 22 min   Charges:   PT Evaluation $PT Eval Moderate Complexity: 1 Mod PT Treatments $Therapeutic Activity: 8-22 mins        Edward Deihl B. Edward Arellano PT, DPT Acute Rehabilitation Services Pager [phone] Office [phone]   Elon Alas [facility] 06/16/2021, 12:36 PM

## 2021-06-16 NOTE — Plan of Care (Signed)

## 2021-06-17 LAB — COMPREHENSIVE METABOLIC PANEL
ALT: 25 U/L (ref 0–44)
AST: 34 U/L (ref 15–41)
Albumin: 3.4 g/dL — ABNORMAL LOW (ref 3.5–5.0)
Alkaline Phosphatase: 77 U/L (ref 38–126)
Anion gap: 8 (ref 5–15)
BUN: 11 mg/dL (ref 8–23)
CO2: 27 mmol/L (ref 22–32)
Calcium: 9.3 mg/dL (ref 8.9–10.3)
Chloride: 103 mmol/L (ref 98–111)
Creatinine, Ser: 1.11 mg/dL (ref 0.61–1.24)
GFR, Estimated: >60 mL/min (ref >60)
Glucose, Bld: 111 mg/dL — ABNORMAL HIGH (ref 70–99)
Potassium: 4 mmol/L (ref 3.5–5.1)
Sodium: 138 mmol/L (ref 135–145)
Total Bilirubin: 0.8 mg/dL (ref 0.3–1.2)
Total Protein: 6.5 g/dL (ref 6.5–8.1)

## 2021-06-17 LAB — CBC WITH DIFFERENTIAL/PLATELET
Abs Immature Granulocytes: 0.02 10*3/uL (ref 0.00–0.07)
Basophils Absolute: 0 10*3/uL (ref 0.0–0.1)
Basophils Relative: 0 %
Eosinophils Absolute: 0.1 10*3/uL (ref 0.0–0.5)
Eosinophils Relative: 2 %
HCT: 51.2 % (ref 39.0–52.0)
Hemoglobin: 17.4 g/dL — ABNORMAL HIGH (ref 13.0–17.0)
Immature Granulocytes: 0 %
Lymphocytes Relative: 21 %
Lymphs Abs: 1.8 10*3/uL (ref 0.7–4.0)
MCH: 32.1 pg (ref 26.0–34.0)
MCHC: 34 g/dL (ref 30.0–36.0)
MCV: 94.5 fL (ref 80.0–100.0)
Monocytes Absolute: 0.9 10*3/uL (ref 0.1–1.0)
Monocytes Relative: 10 %
Neutro Abs: 5.6 10*3/uL (ref 1.7–7.7)
Neutrophils Relative %: 67 %
Platelets: 202 10*3/uL (ref 150–400)
RBC: 5.42 MIL/uL (ref 4.22–5.81)
RDW: 13.3 % (ref 11.5–15.5)
WBC: 8.4 10*3/uL (ref 4.0–10.5)
nRBC: 0 % (ref 0.0–0.2)

## 2021-06-17 LAB — PHOSPHORUS: Phosphorus: 3.6 mg/dL (ref 2.5–4.6)

## 2021-06-17 LAB — MAGNESIUM: Magnesium: 2 mg/dL (ref 1.7–2.4)

## 2021-06-17 LAB — PROTIME-INR
INR: 2.1 — ABNORMAL HIGH (ref 0.8–1.2)
Prothrombin Time: 23.4 seconds — ABNORMAL HIGH (ref 11.4–15.2)

## 2021-06-17 MED ORDER — WARFARIN SODIUM 4 MG PO TABS
4.0000 mg | ORAL_TABLET | Freq: Once | ORAL | Status: DC
  Filled 2021-06-17: qty 1

## 2021-06-17 NOTE — Progress Notes (Signed)
ANTICOAGULATION CONSULT NOTE - Follow Up Consult  Pharmacy Consult for warfarin Indication: atrial fibrillation  Allergies  Allergen Reactions   Nitroglycerin Palpitations   Cyclobenzaprine Other (See Comments)    Dry mouth   Niacin Other (See Comments)    flushing   Tizanidine Other (See Comments)    Dry mouth   Balsam Rash    Positive Patch Test 05/18/2021   Benzoic Acid Rash    Positive Patch Test 05/18/2021    Patient Measurements: Height: 6\' 2"  (188 cm) Weight: 114.4 kg (252 lb 3.3 oz) IBW/kg (Calculated) : 82.2 Heparin Dosing Weight: 106.2 kg  Vital Signs: Temp: 98.7 F (37.1 C) (07/16 0426) Temp Source: Oral (07/16 0426) BP: 118/86 (07/16 0426) Pulse Rate: 88 (07/16 0426)  Labs: Recent Labs    06/14/21 1214 06/15/21 0416 06/15/21 1008 06/16/21 0652 06/16/21 0939 06/17/21 0352  HGB 16.7 16.3  --   --  18.7* 17.4*  HCT 49.6 47.0  --   --  54.8* 51.2  PLT 157 177  --   --  228 202  LABPROT 20.0*  --  19.5* 20.9*  --  23.4*  INR 1.7*  --  1.6* 1.8*  --  2.1*  CREATININE 0.92 1.09  --   --  1.03 1.11  TROPONINIHS 66*  --   --   --   --   --      Estimated Creatinine Clearance: 80.9 mL/min (by C-G formula based on SCr of 1.11 mg/dL).   Assessment: 70yo male presents to ED as code stroke, alert was canceled after neuro exam, admitted for further work up. Plan to continue Coumadin for PAF; current INR below goal at 1.7, last dose taken 7/12. Of note per OV notes pt should be taking 3.75mg  daily except 2.5mg  on Mondays but pt and wife report to med hx tech that he takes 3.75mg  daily except 2.5mg  on Mondays and Wednesdays; the dose was changed at appt <55mo ago as it's unclear whether they've been following the new instructions and forgot on interview with tech vs reverting back to old instructions.  Recent INR subtherapeutic at 1.6.  INR up to 2.1 today  Goal of Therapy:  INR 2-3 Monitor platelets by anticoagulation protocol: Yes   Plan:  Warfarin 4 mg  po x 1 -Monitor INR daily for dose adjustments until therapeutic.  Thank you 3mo, PharmD

## 2021-06-17 NOTE — Discharge Summary (Signed)
Physician Discharge Summary  Edward CoombeDennis P Arellano ZOX:[id number] DOB: 06/06/1951 DOA: 06/13/2021  PCP: System, Provider Not In  Admit date: 06/13/2021 Discharge date: 06/17/2021  Admitted From: Home Disposition: Home  Recommendations for Outpatient Follow-up:  Follow up with PCP in 1-2 weeks Follow-up with Neurology as an outpatient in 1-2 weeks Check PT/INR within 2 to 3 days after discharge Please obtain CMP/CBC, Mag, Phos in one week Please follow up on the following pending results:  Home Health: No Equipment/Devices: None   Discharge Condition: Stable CODE STATUS: FULL CODE Diet recommendation: Heart Healthy Carb Modified Diet  Brief/Interim Summary: The patient is a 70 year old obese male with a past medical history significant for but not limited to proximal atrial fibrillation on anticoagulation with Coumadin, history of seizure disorder on Keppra and Vimpat, history of CAD status post CABG, hypertension, diabetes mellitus type 2 that is diet-controlled, history of CVA, history of permanent pacemaker placement as well as other comorbidities who presented to the ED as a code stroke for acute onset of speech deficit.  Originally EMS was called to the patient's home for chest pain.  On arrival patient initially communicated normally but then followed by a sudden onset of receptive and expressive dysphagia along with difficulty following commands as well as left-sided weakness.  Upon arrival to the ED his symptoms resolved and there was no chest pain or shortness of breath.  Neurology evaluated and felt that his differential diagnosis for presentation includes psychiatric/behavioral etiology and acute encephalopathy but did not really favor stroke versus TIA.  Patient had a head CT done which showed no acute intracranial abnormality and his INR was subtherapeutic.  Patient had a CTA of his head and neck was normal and a CT perfusion of his brain which was normal.  Given the low likelihood of  stroke the potential benefits of tPA were significantly outweighed by the risks.  Neurology recommended continue home doses of Vimpat 200 mg twice daily and Keppra 1000 mg twice daily however there is patient's for poor intake and twitching and seizure-like activity he is given this IV.  We will obtain an EEG and if this is negative he may benefit from a 24-hour EEG monitoring.  Unfortunately patient's pacemaker is not MRI compatible.  We will continue with neurochecks and appreciate further Neurology reccomendations.  Neurology is further evaluated and had an LTM EEG for 48 hours after stopping his Keppra and Vimpat and this was normal.  Neurology discussed about increasing Vimpat dose but patient prefers to remain on same.  He also had some slurred speech is intermittent and because we cannot do an MRI we will repeat a head CT and it did not show any acute stroke.  Just in case neurology checking acetylcholine antibodies to rule out myasthenia gravis.  Patient continued to have some tremors that visual hallucinations with closing his eyes and neurology feels that he may have some delirium versus dementia.  We will going to check a UA and SLP is still ordered and pending to be done given his slurred speech is been intermittent.  His speech is back to normal and he has had no issues.  Urinalysis was unremarkable.  He has been deemed stable for discharge and SLP has not seen the patient yet about he can follow-up with his primary neurologist for an outpatient SLP referral if necessary.  He is stable to be discharged at this time and had no issues ambulating.  PT OT recommended no follow-up.  Discharge Diagnoses:  Principal Problem:  Stroke-like symptoms Active Problems:   Persistent atrial fibrillation (HCC)   H/O: stroke   Seizure disorder (HCC)   Pacemaker  Stroke-like symptoms - Transient Speech Disturbance, visual hallucinations and Confusion -Neurology thinks acute stroke/TIA is possible but less  likely and that the transition alteration of awareness and speech disturbance as well as generalized twitching could be a differential for seizure versus sleep disorder -? Acute encephalopathy from subclinical seizure activity? -Neurology recommending video EEG monitoring characterization of spells and holding his Keppra and Vimpat to increase the sensitivity of EEG.  They are recommending continuing Klonopin for anxiety and recommending possible MRI after EEG to look for any intracranial abnormalities however MRI brain unable to be done given Pacemaker Information at this institution -Cont ASA 81 for now -Continue crestor -Appreciate neurology recommendations patient is now getting LTM. -Patient started having some dysarthria and difficulty speaking and slurred speech.  He was also having some transient visual disturbances when he closes eyes.  Neurology is obtaining a speech eval for his dysarthria.  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 Mild atrophy and mild chronic microvascular ischemic change in the white matter." -Neurology feels that the patient may have some early dementia but think that his episodes were likely related to delirium and not sleeping -Neurology recommends checking a UA as well as a speech evaluation if these are normal patient can likely be discharged from neurological standpoint; UA was unremarkable and patient's speech is back to normal so we will hold off on the speech evaluation and this can be done in outpatient setting if necessary -PT and OT evaluated recommending no  PT Follow up -Patient is stable to be discharged home at this time   A.Fib  -Continue Verapamil 40 mg po BID -Cont Metoprolol Succinate 50 mg po Daily  -Continue coumadin per pharm; last PT/INR was 23.4/2.1 -Continue to Monitor on Telemetry whle hospitalized -Have PT-INR Checked in 3 Days    Dyspnea on Exertion -Check ECHO and BNP; BNP was 70.7 -Troponin was 94 and titrated down to 66 -Repeat CXR in the Am as initial CXR showed "Cardiomegaly with mild vascular congestion.  No focal consolidation." -Will need an Ambulatory Home O2 screen prior to D/C he was not SOB and ambulating Independently    Diabetes Mellitus Type 2 -HbA1c was 6.5 -Continue to Monitor and trend blood Sugars carefully; resume morning CMP's ranging from 103-120 -If Necessary will place on Sensitive Novolog AC HS    Seizure Disorder  -Was Holding keppra and Vimpat but now resumed by Neurology -Seiuzure Precautions -Checking Spot EEG and LTM; EEG was normal and within normal limits -Neurology consulted for further evaluation and recommendations; they are recommending continue to hold his antiepileptics to increase the sensitivity of video EEG for now but have now discontinued EEG monitoring and resumed Home Meds   Anxiety -Continue Clonazepam 1 mg po BID    Recent cellulitis / 'spider bite'  -Continue Doxycycline 100 mg po BID   Obesity -Complicates overall care and prognosis -Estimated body mass index is 32.38 kg/m as calculated from the following:   Height  as of this encounter:  (1.88 m).   Weight as of this encounter: 114.4 kg. -Weight Loss and Dietary Counseling given  Discharge Instructions  Discharge Instructions     Call MD for:  difficulty breathing, headache or visual disturbances   Complete by: As directed    Call MD for:  extreme fatigue   Complete by: As directed    Call MD for:  hives   Complete by: As directed    Call MD for:  persistant dizziness or light-headedness   Complete  by: As directed    Call MD for:  persistant nausea and vomiting   Complete by: As directed    Call MD for:  redness, tenderness, or signs of infection (pain, swelling, redness, odor or green/yellow discharge around incision site)   Complete by: As directed    Call MD for:  severe uncontrolled pain   Complete by: As directed    Call MD for:  temperature >100.4   Complete by: As directed    Diet - low sodium heart healthy   Complete by: As directed    Discharge instructions   Complete by: As directed    You were cared for by a hospitalist during your hospital stay. If you have any questions about your discharge medications or the care you received while you were in the hospital after you are discharged, you can call the unit and ask to speak with the hospitalist on call if the hospitalist that took care of you is not available. Once you are discharged, your primary care physician will handle any further medical issues. Please note that NO REFILLS for any discharge medications will be authorized once you are discharged, as it is imperative that you return to your primary care physician (or establish a relationship with a primary care physician if you do not have one) for your aftercare needs so that they can reassess your need for medications and monitor your lab values.  Follow up with PCP and Neurology as an outpatient within 1-2 weeks. Take all medications as prescribed. If symptoms change or worsen please return to the ED for evaluation   Increase activity slowly   Complete by: As directed       Allergies as of 06/17/2021       Reactions   Nitroglycerin Palpitations   Cyclobenzaprine Other (See Comments)   Dry mouth   Niacin Other (See Comments)   flushing   Tizanidine Other (See Comments)   Dry mouth   Balsam Rash   Positive Patch Test 05/18/2021   Benzoic Acid Rash   Positive Patch Test 05/18/2021        Medication List     TAKE these medications    acetaminophen 325 MG  tablet Commonly known as: TYLENOL Take 650 mg by mouth in the morning and at bedtime.   aspirin 81 MG EC tablet Take 81 mg by mouth daily.   BIOFREEZE EX Apply 1 application topically daily as needed (pain).   cholecalciferol 1000 units tablet Commonly known as: VITAMIN D Take 1,000 Units by mouth daily.   clonazePAM 1 MG tablet Commonly known as: KLONOPIN Take 1 mg by mouth 2 (two) times daily. Name brand only   doxycycline 100 MG capsule Commonly known as: VIBRAMYCIN Take 100 mg by mouth See admin instructions. Bid x 10 days   escitalopram 10 MG tablet Commonly known as: LEXAPRO Take 10 mg by mouth every morning.   esomeprazole 40 MG capsule Commonly known as:  NEXIUM Take 40 mg by mouth daily.   fluticasone 50 MCG/ACT nasal spray Commonly known as: FLONASE Place 1 spray into both nostrils daily as needed for allergies.   levETIRAcetam 1000 MG tablet Commonly known as: KEPPRA Take 1,000 mg by mouth 2 (two) times daily.   MAGnesium-Oxide 400 (240 Mg) MG tablet Generic drug: magnesium oxide Take 1 tablet by mouth daily.   metoprolol succinate 50 MG 24 hr tablet Commonly known as: TOPROL-XL Take 50 mg by mouth daily. Take with or immediately following a meal.   mirtazapine 15 MG tablet Commonly known as: REMERON Take 7.5-15 mg by mouth at bedtime.   rosuvastatin 20 MG tablet Commonly known as: CRESTOR Take 20 mg by mouth every evening.   triamcinolone ointment 0.1 % Commonly known as: KENALOG Apply 1 application topically in the morning and at bedtime. Apply to spider bite   verapamil 40 MG tablet Commonly known as: CALAN Take 40 mg by mouth in the morning and at bedtime.   Vimpat 200 MG Tabs tablet Generic drug: lacosamide Take 200 mg by mouth 2 (two) times daily.   vitamin B-12 1000 MCG tablet Commonly known as: CYANOCOBALAMIN Take 1,000 mcg by mouth daily.   vitamin C 500 MG tablet Commonly known as: ASCORBIC ACID Take 500 mg by mouth at  bedtime.   warfarin 2.5 MG tablet Commonly known as: COUMADIN Take 2.5 mg by mouth See admin instructions. 1&1/2 daily 2.5 mg Monday and wednesday        Allergies  Allergen Reactions   Nitroglycerin Palpitations   Cyclobenzaprine Other (See Comments)    Dry mouth   Niacin Other (See Comments)    flushing   Tizanidine Other (See Comments)    Dry mouth   Balsam Rash    Positive Patch Test 05/18/2021   Benzoic Acid Rash    Positive Patch Test 05/18/2021    Consultations: Neurology  Procedures/Studies: CT HEAD WO CONTRAST  Result Date: 06/16/2021 CLINICAL DATA:  Stroke follow-up. Speech disturbance and left-sided weakness EXAM: CT HEAD WITHOUT CONTRAST TECHNIQUE: Contiguous axial images were obtained from the base of the skull through the vertex without intravenous contrast. COMPARISON:  CT head 06/13/2021 FINDINGS: Brain: No evidence of acute infarction, hemorrhage, hydrocephalus, extra-axial collection or mass lesion/mass effect. Mild atrophy and mild periventricular white matter hypodensity bilaterally. Vascular: Negative for hyperdense vessel Skull: Negative Sinuses/Orbits: Paranasal sinuses clear. Bilateral cataract extraction Other: None IMPRESSION: No acute abnormality. Mild atrophy and mild chronic microvascular ischemic change in the white matter. Electronically Signed   By: Marlan Palau M.D.   On: 06/16/2021 15:54   DG Chest Portable 1 View  Result Date: 06/13/2021 CLINICAL DATA:  70 year old male with chest pain. EXAM: PORTABLE CHEST 1 VIEW COMPARISON:  Chest radiograph dated 06/13/2021. FINDINGS: There is cardiomegaly with mild vascular congestion. Minimal bibasilar atelectasis. No focal consolidation, pleural effusion, or pneumothorax. Median sternotomy wires. Left pectoral pacemaker device. No acute osseous pathology. IMPRESSION: Cardiomegaly with mild vascular congestion.  No focal consolidation. Electronically Signed   By: Elgie Collard M.D.   On: 06/13/2021 23:43    EEG adult  Result Date: 06/14/2021 Charlsie Quest, MD     06/14/2021  1:12 PM Patient Name: Edward Arellano MRN: [id number] Epilepsy Attending: Charlsie Quest Referring Physician/Provider: Dr Lindie Spruce Date: 06/14/2021 Duration: 25.46 mins Patient history: 70 year old history of seizure-like episodes who presented with an episode of twitching and altered awareness.  EEG to evaluate for seizures. Level of alertness: Awake, drowsy AEDs  during EEG study: Klonopin, Keppra, Vimpat Technical aspects: This EEG study was done with scalp electrodes positioned according to the 10-20 International system of electrode placement. Electrical activity was acquired at a sampling rate of 500Hz  and reviewed with a high frequency filter of 70Hz  and a low frequency filter of 1Hz . EEG data were recorded continuously and digitally stored. Description: The posterior dominant rhythm consists of  7.5 Hz activity of moderate voltage (25-35 uV) seen predominantly in posterior head regions, symmetric and reactive to eye opening and eye closing. Drowsiness was characterized by attenuation of the posterior background rhythm.  Hyperventilation and photic stimulation were not performed.   IMPRESSION: This study is within normal limits. No seizures or epileptiform discharges were seen throughout the recording. Priyanka   Overnight EEG with video  Result Date: 06/15/2021 , MD     06/16/2021  8:55 AM Patient Name: Edward Arellano MRN: Charlsie Quest Epilepsy Attending: 06/18/2021 Referring Physician/Provider: Dr Edward Arellano Duration: 06/14/2021 1207 to 06/15/2021 1207  Patient history: 70 year old history of seizure-like episodes who presented with an episode of twitching and altered awareness.  EEG to evaluate for seizures.  Level of alertness: Awake, asleep  AEDs during EEG study: Klonopin  Technical aspects: This EEG study was done with scalp electrodes positioned according to the 10-20 International  system of electrode placement. Electrical activity was acquired at a sampling rate of 500Hz  and reviewed with a high frequency filter of 70Hz  and a low frequency filter of 1Hz . EEG data were recorded continuously and digitally stored.  Description: The posterior dominant rhythm consists of  7.5 Hz activity of moderate voltage (25-35 uV) seen predominantly in posterior head regions, symmetric and reactive to eye opening and eye closing.  Sleep is characterized by vertex waves, sleep spindles (12 to 14 Hz), maximum frontocentral region.  Physiologic photic driving was seen during photic stimulation.  No EEG change was seen during hyperventilation.  IMPRESSION: This study is within normal limits. No seizures or epileptiform discharges were seen throughout the recording.  06/17/2021   ECHOCARDIOGRAM COMPLETE  Result Date: 06/14/2021    ECHOCARDIOGRAM REPORT   Patient Name:   Edward Arellano Date of Exam: 06/14/2021 Medical Rec #:        Height:       74.0 in Accession #:         Weight:       252.2 lb Date of Birth:  06-14-1951        BSA:          2.399 m Patient Age:    70 years        BP:           137/71 mmHg Patient Gender: M               HR:           69 bpm. Exam Location:  Inpatient Procedure: 2D Echo, Cardiac Doppler and Color Doppler Indications:    TIA G45.9  History:        Patient has prior history of Echocardiogram examinations, most                 recent 12/22/2016. CAD, Pacemaker, Stroke, Arrythmias:Atrial                 Fibrillation; Risk Factors:Hypertension, Diabetes and Sleep                 Apnea. Seizures. GERD.  Sonographer:    Elmarie Shiley Dance Referring Phys: [id number] JARED M GARDNER IMPRESSIONS  1. Left ventricular ejection fraction, by estimation, is 55%. The left ventricle has normal function. The left ventricle demonstrates regional wall motion abnormalities with septal-lateral dyssynchrony consistent with LBBB. Left ventricular diastolic parameters are consistent with  Grade I diastolic dysfunction (impaired relaxation).  2. Right ventricular systolic function is mildly reduced. The right ventricular size is normal. Tricuspid regurgitation signal is inadequate for assessing PA pressure.  3. The aortic valve is tricuspid. Aortic valve regurgitation is not visualized. Mild aortic valve sclerosis is present, with no evidence of aortic valve stenosis.  4. The mitral valve is normal in structure. No evidence of mitral valve regurgitation. No evidence of mitral stenosis.  5. The inferior vena cava is normal in size with greater than 50% respiratory variability, suggesting right atrial pressure of 3 mmHg. FINDINGS  Left Ventricle: Left ventricular ejection fraction, by estimation, is 55%. The left ventricle has normal function. The left ventricle demonstrates regional wall motion abnormalities. The left ventricular internal cavity size was normal in size. There is  no left ventricular hypertrophy. Left ventricular diastolic parameters are consistent with Grade I diastolic dysfunction (impaired relaxation). Right Ventricle: The right ventricular size is normal. No increase in right ventricular wall thickness. Right ventricular systolic function is mildly reduced. Tricuspid regurgitation signal is inadequate for assessing PA pressure. Left Atrium: Left atrial size was normal in size. Right Atrium: Right atrial size was normal in size. Pericardium: There is no evidence of pericardial effusion. Mitral Valve: The mitral valve is normal in structure. No evidence of mitral valve regurgitation. No evidence of mitral valve stenosis. Tricuspid Valve: The tricuspid valve is normal in structure. Tricuspid valve regurgitation is trivial. Aortic Valve: The aortic valve is tricuspid. Aortic valve regurgitation is not visualized. Mild aortic valve sclerosis is present, with no evidence of aortic valve stenosis. Pulmonic Valve: The pulmonic valve was normal in structure. Pulmonic valve regurgitation is  not visualized. Aorta: The aortic root is normal in size and structure. Venous: The inferior vena cava is normal in size with greater than 50% respiratory variability, suggesting right atrial pressure of 3 mmHg. IAS/Shunts: No atrial level shunt detected by color flow Doppler.  LEFT VENTRICLE PLAX 2D LVIDd:         4.20 cm  Diastology LVIDs:         3.60 cm  LV e' medial:    5.97 cm/s LV PW:         1.60 cm  LV E/e' medial:  9.5 LV IVS:        1.00 cm  LV e' lateral:   10.60 cm/s LVOT diam:     2.40 cm  LV E/e' lateral: 5.4 LV SV:         70 LV SV Index:   29 LVOT Area:     4.52 cm  RIGHT VENTRICLE            IVC RV Basal diam:  3.00 cm    IVC diam: 1.80 cm RV S prime:     7.30 cm/s TAPSE (M-mode): 1.5 cm LEFT ATRIUM             Index       RIGHT ATRIUM           Index LA diam:        4.10 cm 1.71 cm/m  RA Area:     15.60 cm LA Vol (A2C):   50.6 ml 21.10  ml/m RA Volume:   35.70 ml  14.88 ml/m LA Vol (A4C):   59.1 ml 24.64 ml/m LA Biplane Vol: 56.3 ml 23.47 ml/m  AORTIC VALVE LVOT Vmax:   75.50 cm/s LVOT Vmean:  53.400 cm/s LVOT VTI:    0.155 m  AORTA Ao Root diam: 3.50 cm Ao Asc diam:  3.10 cm MITRAL VALVE MV Area (PHT): 3.21 cm    SHUNTS MV Decel Time: 236 msec    Systemic VTI:  0.16 m MV E velocity: 57.00 cm/s  Systemic Diam: 2.40 cm MV A velocity: 65.30 cm/s MV E/A ratio:  0.87 Marca Ancona MD Electronically signed by Marca Ancona MD Signature Date/Time: 06/14/2021/3:09:33 PM    Final    CT HEAD CODE STROKE WO CONTRAST  Result Date: 06/13/2021 CLINICAL DATA:  Code stroke.  Slurred speech with left arm weakness EXAM: CT HEAD WITHOUT CONTRAST TECHNIQUE: Contiguous axial images were obtained from the base of the skull through the vertex without intravenous contrast. COMPARISON:  03/08/2021 FINDINGS: Brain: There is no mass, hemorrhage or extra-axial collection. The size and configuration of the ventricles and extra-axial CSF spaces are normal. There is hypoattenuation of the periventricular white  matter, most commonly indicating chronic ischemic microangiopathy. Vascular: No abnormal hyperdensity of the major intracranial arteries or dural venous sinuses. No intracranial atherosclerosis. Skull: The visualized skull base, calvarium and extracranial soft tissues are normal. Sinuses/Orbits: No fluid levels or advanced mucosal thickening of the visualized paranasal sinuses. No mastoid or middle ear effusion. The orbits are normal. ASPECTS [facility] Stroke Program Early CT Score) - Ganglionic level infarction (caudate, lentiform nuclei, internal capsule, insula, M1-M3 cortex): 7 - Supraganglionic infarction (M4-M6 cortex): 3 Total score (0-10 with 10 being normal): 10 IMPRESSION: 1. Chronic ischemic microangiopathy without acute intracranial abnormality. 2. ASPECTS is 10. These results were communicated to Dr. Caryl Pina at 11:11 pm on 06/13/2021 by text page via the [facility] messaging system. Electronically Signed   By: Deatra Robinson M.D.   On: 06/13/2021 23:11   CT ANGIO HEAD NECK W WO CM W PERF (CODE STROKE)  Result Date: 06/13/2021 CLINICAL DATA:  Slurred speech with left-sided weakness EXAM: CT ANGIOGRAPHY HEAD AND NECK CT PERFUSION BRAIN TECHNIQUE: Multidetector CT imaging of the head and neck was performed using the standard protocol during bolus administration of intravenous contrast. Multiplanar CT image reconstructions and MIPs were obtained to evaluate the vascular anatomy. Carotid stenosis measurements (when applicable) are obtained utilizing NASCET criteria, using the distal internal carotid diameter as the denominator. Multiphase CT imaging of the brain was performed following IV bolus contrast injection. Subsequent parametric perfusion maps were calculated using RAPID software. CONTRAST:  OMNIPAQUE IOHEXOL 350 MG/ML SOLN COMPARISON:  None. FINDINGS: CTA NECK FINDINGS SKELETON: There is no bony spinal canal stenosis. No lytic or blastic lesion. OTHER NECK: Normal pharynx, larynx and major  salivary glands. No cervical lymphadenopathy. Unremarkable thyroid gland. UPPER CHEST: No pneumothorax or pleural effusion. No nodules or masses. AORTIC ARCH: There is no calcific atherosclerosis of the aortic arch. There is no aneurysm, dissection or hemodynamically significant stenosis of the visualized portion of the aorta. Conventional 3 vessel aortic branching pattern. The visualized proximal subclavian arteries are widely patent. RIGHT CAROTID SYSTEM: Normal without aneurysm, dissection or stenosis. LEFT CAROTID SYSTEM: Normal without aneurysm, dissection or stenosis. VERTEBRAL ARTERIES: Left dominant configuration. Both origins are clearly patent. There is no dissection, occlusion or flow-limiting stenosis to the skull base (V1-V3 segments). CTA HEAD FINDINGS POSTERIOR CIRCULATION: --Vertebral arteries: Normal V4 segments. --  Inferior cerebellar arteries: Normal. --Basilar artery: Normal. --Superior cerebellar arteries: Normal. --Posterior cerebral arteries (PCA): Normal. ANTERIOR CIRCULATION: --Intracranial internal carotid arteries: Normal. --Anterior cerebral arteries (ACA): Normal. Both A1 segments are present. Patent anterior communicating artery (a-comm). --Middle cerebral arteries (MCA): Normal. VENOUS SINUSES: As permitted by contrast timing, patent. ANATOMIC VARIANTS: None Review of the MIP images confirms the above findings. CT Brain Perfusion Findings: ASPECTS: 10 CBF (<30%) Volume: 25mL Perfusion (Tmax>6.0s) volume: 35mL Mismatch Volume: 65mL Infarction Location:None IMPRESSION: 1. Normal CTA of the head and neck. 2. Normal CT perfusion. Electronically Signed   By: Deatra Robinson M.D.   On: 06/13/2021 23:29    EEG 7/14   Description: The posterior dominant rhythm consists of  7.5 Hz activity of moderate voltage (25-35 uV) seen predominantly in posterior head regions, symmetric and reactive to eye opening and eye closing.  Sleep is characterized by vertex waves, sleep spindles (12 to 14 Hz), maximum  frontocentral region.  Hyperventilation and photic stimulation were not performed.      IMPRESSION: This study is within normal limits. No seizures or epileptiform discharges were seen throughout the recording.  EEG 7/15   Description: The posterior dominant rhythm consists of  7.5 Hz activity of moderate voltage (25-35 uV) seen predominantly in posterior head regions, symmetric and reactive to eye opening and eye closing.  Sleep is characterized by vertex waves, sleep spindles (12 to 14 Hz), maximum frontocentral region.     IMPRESSION: This study is within normal limits. No seizures or epileptiform discharges were seen throughout the recording.   Subjective: Seen and examined at bedside and he felt much better and felt back to his baseline.  He is ambulating without issues and was not short of breath.  States that his neurological issues symptoms have significantly and fully resolved.  He is stable for discharge only to follow-up with PCP as well as outpatient neurology in outpatient setting and he understands agrees with the plan of care.   Discharge Exam: Vitals:   06/17/21 0810 06/17/21 1159  BP: 129/87 127/81  Pulse: 86 76  Resp: 18 18  Temp: 97.9 F (36.6 C) 98.3 F (36.8 C)  SpO2: 95% 97%   Vitals:   06/16/21 2339 06/17/21 0426 06/17/21 0810 06/17/21 1159  BP: 115/71 118/86 129/87 127/81  Pulse: 81 88 86 76  Resp: 18 18 18 18   Temp: 98.9 F (37.2 C) 98.7 F (37.1 C) 97.9 F (36.6 C) 98.3 F (36.8 C)  TempSrc: Oral Oral Oral Oral  SpO2: 96% 94% 95% 97%  Weight:      Height:       General: Pt is alert, awake, not in acute distress Cardiovascular: RRR, S1/S2 +, no rubs, no gallops Respiratory: Mildly diminished bilaterally, no wheezing, no rhonchi; unlabored breathing Abdominal: Soft, NT, distended secondary body habitus, bowel sounds + Extremities: no edema, no cyanosis  The results of significant diagnostics from this hospitalization (including imaging,  microbiology, ancillary and laboratory) are listed below for reference.    Microbiology: Recent Results (from the past 240 hour(s))  Resp Panel by RT-PCR (Flu A&B, Covid) Nasopharyngeal Swab     Status: None   Collection Time: 06/13/21 11:20 PM   Specimen: Nasopharyngeal Swab; Nasopharyngeal(NP) swabs in vial transport medium  Result Value Ref Range Status   SARS Coronavirus 2 by RT PCR NEGATIVE NEGATIVE Final    Comment: (NOTE) SARS-CoV-2 target nucleic acids are NOT DETECTED.  The SARS-CoV-2 RNA is generally detectable in upper respiratory specimens during the  acute phase of infection. The lowest concentration of SARS-CoV-2 viral copies this assay can detect is 138 copies/mL. A negative result does not preclude SARS-Cov-2 infection and should not be used as the sole basis for treatment or other patient management decisions. A negative result may occur with  improper specimen collection/handling, submission of specimen other than nasopharyngeal swab, presence of viral mutation(s) within the areas targeted by this assay, and inadequate number of viral copies(<138 copies/mL). A negative result must be combined with clinical observations, patient history, and epidemiological information. The expected result is Negative.  Fact Sheet for Patients:  BloggerCourse.com  Fact Sheet for Healthcare Providers:  SeriousBroker.it  This test is no t yet approved or cleared by the [place] FDA and  has been authorized for detection and/or diagnosis of SARS-CoV-2 by FDA under an Emergency Use Authorization (EUA). This EUA will remain  in effect (meaning this test can be used) for the duration of the COVID-19 declaration under Section 564(b)(1) of the Act, 21 U.S.C.section 360bbb-3(b)(1), unless the authorization is terminated  or revoked sooner.       Influenza A by PCR NEGATIVE NEGATIVE Final   Influenza B by PCR NEGATIVE NEGATIVE Final     Comment: (NOTE) The Xpert Xpress SARS-CoV-2/FLU/RSV plus assay is intended as an aid in the diagnosis of influenza from Nasopharyngeal swab specimens and should not be used as a sole basis for treatment. Nasal washings and aspirates are unacceptable for Xpert Xpress SARS-CoV-2/FLU/RSV testing.  Fact Sheet for Patients: BloggerCourse.com  Fact Sheet for Healthcare Providers: SeriousBroker.it  This test is not yet approved or cleared by the [place] FDA and has been authorized for detection and/or diagnosis of SARS-CoV-2 by FDA under an Emergency Use Authorization (EUA). This EUA will remain in effect (meaning this test can be used) for the duration of the COVID-19 declaration under Section 564(b)(1) of the Act, 21 U.S.C. section 360bbb-3(b)(1), unless the authorization is terminated or revoked.  Performed at [facility], [address], [place], [place] [address]     Labs: BNP (last 3 results) Recent Labs    06/14/21 1214  BNP 70.7   Basic Metabolic Panel: Recent Labs  Lab 06/13/21 2245 06/13/21 2249 06/14/21 1214 06/15/21 0416 06/16/21 0939 06/17/21 0352  NA 137 139 138 138 139 138  K 4.3 4.2 4.0 4.3 3.7 4.0  CL 104 103 104 106 103 103  CO2 25  --  27 25 28 27   GLUCOSE 106* 106* 103* 120* 112* 111*  BUN 10 13 10 13 10 11   CREATININE 1.03 1.00 0.92 1.09 1.03 1.11  CALCIUM 9.0  --  9.0 8.9 10.0 9.3  MG  --   --  1.9 2.0 2.0 2.0  PHOS  --   --  2.8 3.1 2.5 3.6   Liver Function Tests: Recent Labs  Lab 06/13/21 2245 06/14/21 1214 06/15/21 0416 06/16/21 0939 06/17/21 0352  AST 33 28 32 37 34  ALT 23 23 20 28 25   ALKPHOS 95 77 80 92 77  BILITOT 0.5 0.6 0.8 0.6 0.8  PROT 6.5 5.9* 6.0* 7.6 6.5  ALBUMIN 3.6 3.1* 3.1* 3.9 3.4*   No results for input(s): LIPASE, AMYLASE in the last 168 hours. No results for input(s): AMMONIA in the last 168 hours. CBC: Recent Labs  Lab 06/13/21 2245  06/13/21 2249 06/14/21 1214 06/15/21 0416 06/16/21 0939 06/17/21 0352  WBC 7.9  --  5.5 7.5 9.8 8.4  NEUTROABS 4.7  --  3.2 5.0  6.7 5.6  HGB 16.8 17.0 16.7 16.3 18.7* 17.4*  HCT 50.4 50.0 49.6 47.0 54.8* 51.2  MCV 96.2  --  96.1 94.6 94.0 94.5  PLT 204  --  157 177 228 202   Cardiac Enzymes: No results for input(s): CKTOTAL, CKMB, CKMBINDEX, TROPONINI in the last 168 hours. BNP: Invalid input(s): POCBNP CBG: Recent Labs  Lab 06/13/21 2245 06/16/21 1151  GLUCAP 114* 120*   D-Dimer No results for input(s): DDIMER in the last 72 hours. Hgb A1c No results for input(s): HGBA1C in the last 72 hours. Lipid Profile No results for input(s): CHOL, HDL, LDLCALC, TRIG, CHOLHDL, LDLDIRECT in the last 72 hours. Thyroid function studies No results for input(s): TSH, T4TOTAL, T3FREE, THYROIDAB in the last 72 hours.  Invalid input(s): FREET3 Anemia work up No results for input(s): VITAMINB12, FOLATE, FERRITIN, TIBC, IRON, RETICCTPCT in the last 72 hours. Urinalysis    Component Value Date/Time   COLORURINE YELLOW 06/16/2021 1800   APPEARANCEUR HAZY (A) 06/16/2021 1800   LABSPEC 1.016 06/16/2021 1800   PHURINE 5.0 06/16/2021 1800   GLUCOSEU NEGATIVE 06/16/2021 1800   HGBUR NEGATIVE 06/16/2021 1800   BILIRUBINUR NEGATIVE 06/16/2021 1800   KETONESUR NEGATIVE 06/16/2021 1800   PROTEINUR 30 (A) 06/16/2021 1800   NITRITE NEGATIVE 06/16/2021 1800   LEUKOCYTESUR NEGATIVE 06/16/2021 1800   Sepsis Labs Invalid input(s): PROCALCITONIN,  WBC,  LACTICIDVEN Microbiology Recent Results (from the past 240 hour(s))  Resp Panel by RT-PCR (Flu A&B, Covid) Nasopharyngeal Swab     Status: None   Collection Time: 06/13/21 11:20 PM   Specimen: Nasopharyngeal Swab; Nasopharyngeal(NP) swabs in vial transport medium  Result Value Ref Range Status   SARS Coronavirus 2 by RT PCR NEGATIVE NEGATIVE Final    Comment: (NOTE) SARS-CoV-2 target nucleic acids are NOT DETECTED.  The SARS-CoV-2 RNA is  generally detectable in upper respiratory specimens during the acute phase of infection. The lowest concentration of SARS-CoV-2 viral copies this assay can detect is 138 copies/mL. A negative result does not preclude SARS-Cov-2 infection and should not be used as the sole basis for treatment or other patient management decisions. A negative result may occur with  improper specimen collection/handling, submission of specimen other than nasopharyngeal swab, presence of viral mutation(s) within the areas targeted by this assay, and inadequate number of viral copies(<138 copies/mL). A negative result must be combined with clinical observations, patient history, and epidemiological information. The expected result is Negative.  Fact Sheet for Patients:  BloggerCourse.com  Fact Sheet for Healthcare Providers:  SeriousBroker.it  This test is no t yet approved or cleared by the [place] FDA and  has been authorized for detection and/or diagnosis of SARS-CoV-2 by FDA under an Emergency Use Authorization (EUA). This EUA will remain  in effect (meaning this test can be used) for the duration of the COVID-19 declaration under Section 564(b)(1) of the Act, 21 U.S.C.section 360bbb-3(b)(1), unless the authorization is terminated  or revoked sooner.       Influenza A by PCR NEGATIVE NEGATIVE Final   Influenza B by PCR NEGATIVE NEGATIVE Final    Comment: (NOTE) The Xpert Xpress SARS-CoV-2/FLU/RSV plus assay is intended as an aid in the diagnosis of influenza from Nasopharyngeal swab specimens and should not be used as a sole basis for treatment. Nasal washings and aspirates are unacceptable for Xpert Xpress SARS-CoV-2/FLU/RSV testing.  Fact Sheet for Patients: BloggerCourse.com  Fact Sheet for Healthcare Providers: SeriousBroker.it  This test is not yet approved or cleared by the [place] FDA and has been authorized for detection and/or diagnosis of SARS-CoV-2 by FDA under an Emergency Use Authorization (EUA). This EUA will remain in effect (meaning this test can be used) for the duration of the COVID-19 declaration under Section 564(b)(1) of the Act, 21 U.S.C. section 360bbb-3(b)(1), unless the authorization is terminated or revoked.  Performed at [facility], [address], [place], [place] [address]    Time coordinating discharge: 35 minutes  SIGNED:  Merlene Laughter, DO [facility] 06/17/2021, 5:07 PM Pager is on AMION  If 7PM-7AM, please contact night-coverage [email or web address]

## 2021-06-17 NOTE — Progress Notes (Signed)
Patient has ordered for discharge. Given discharge instructions with paper to the patent and family. Iv removed. Given all belongings to the patient and family.

## 2021-06-20 LAB — ACETYLCHOLINE RECEPTOR AB, ALL
Acety choline binding ab: <0.03 nmol/L (ref 0.00–0.24)
Acetylchol Block Ab: 10 % (ref 0–25)

## 2022-07-04 IMAGING — CT CT HEAD CODE STROKE
4 series · 16 of 47 positions shown, 18 images · non-contrast
Comparison: 03/08/2021

CLINICAL DATA: Code stroke.  Slurred speech with left arm weakness

EXAM:
CT HEAD WITHOUT CONTRAST
TECHNIQUE: Contiguous axial images were obtained from the base of the skull
through the vertex without intravenous contrast.

[Series 3: head wo · axial · 0.43mm/px · z∈[+1104,+1234]mm · 7 of 36 slices shown, 9 images]
[im 5/36  brain]
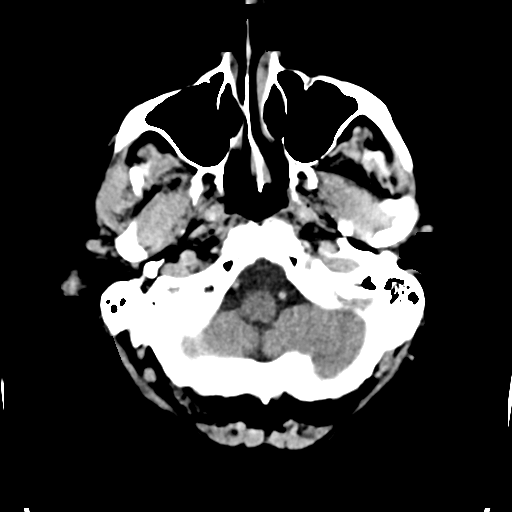
[im 5/36  bone]
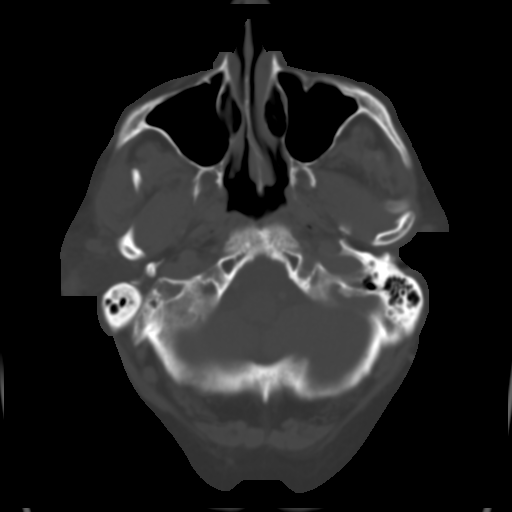
[im 9/36  brain]
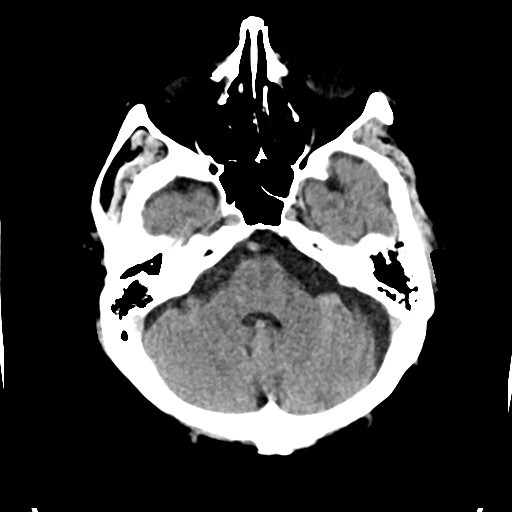
[im 14/36  brain]
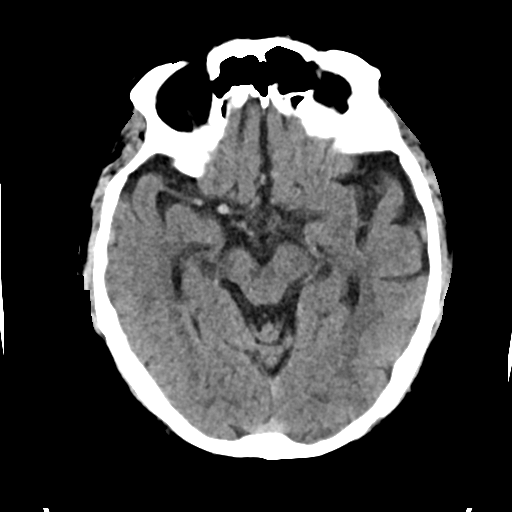
[im 18/36  brain]
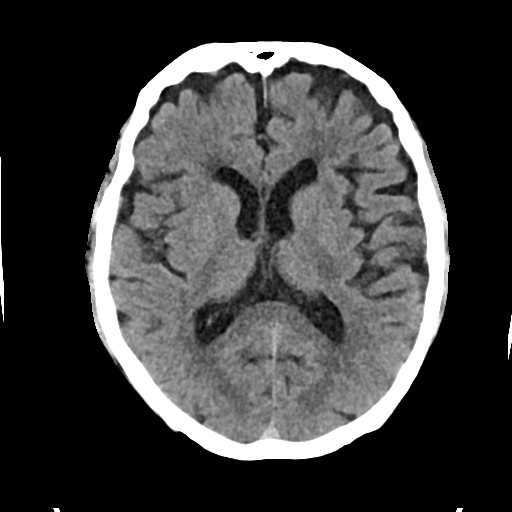
[im 22/36  brain]
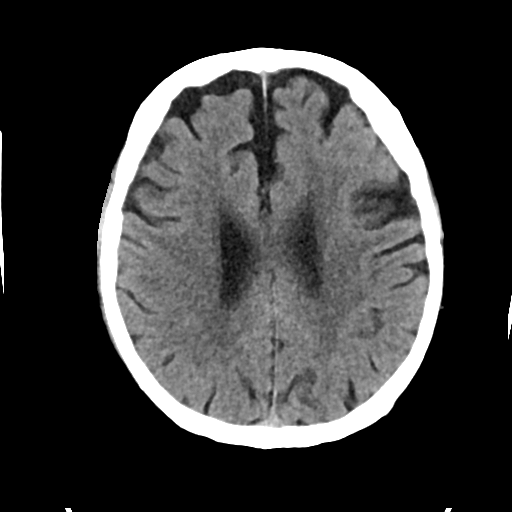
[im 22/36  bone]
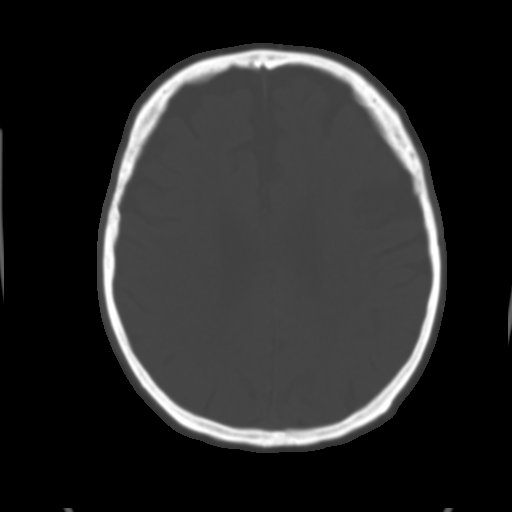
[im 27/36  brain]
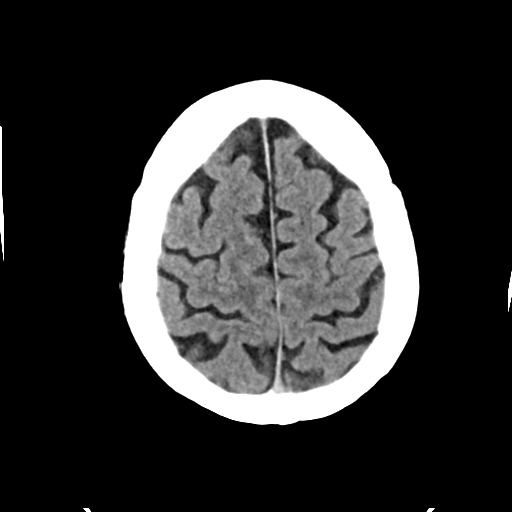
[im 31/36  brain]
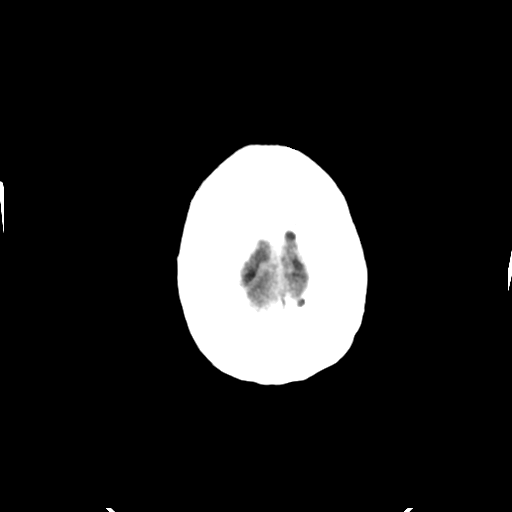

[Series 4: head bone · axial · 0.43mm/px · z∈[+1100,+1136]mm · 3 of 88 slices shown]
[im 9/88  bone]
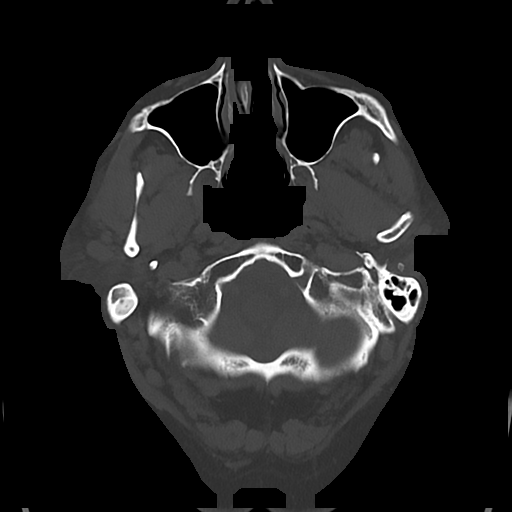
[im 18/88  bone]
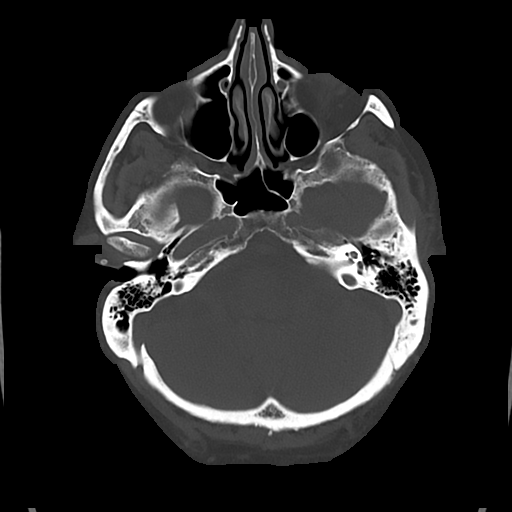
[im 27/88  bone]
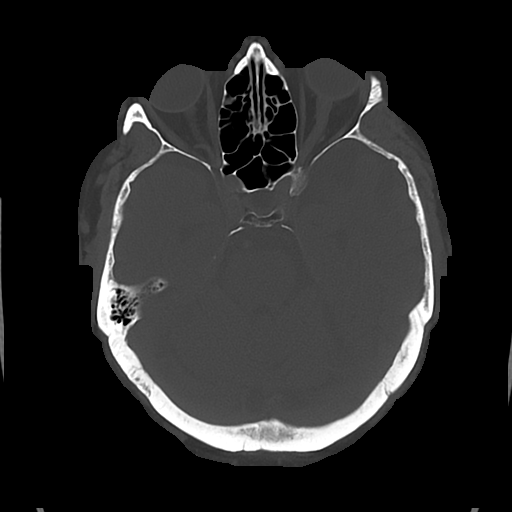

[Series 5: cor soft · coronal · 0.34mm/px · 3 of 70 slices shown]
[im 24/70  brain]
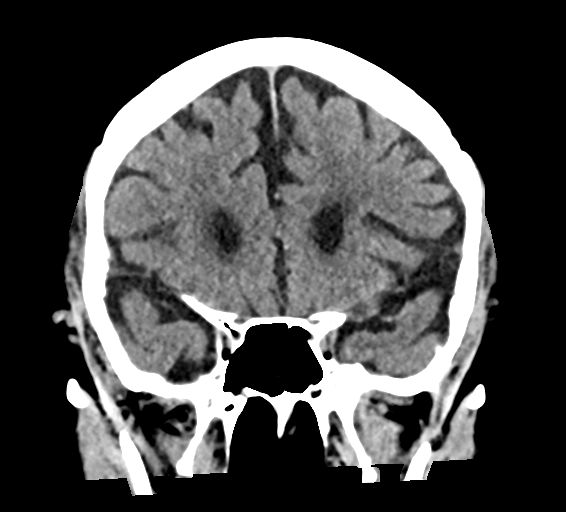
[im 31/70  brain]
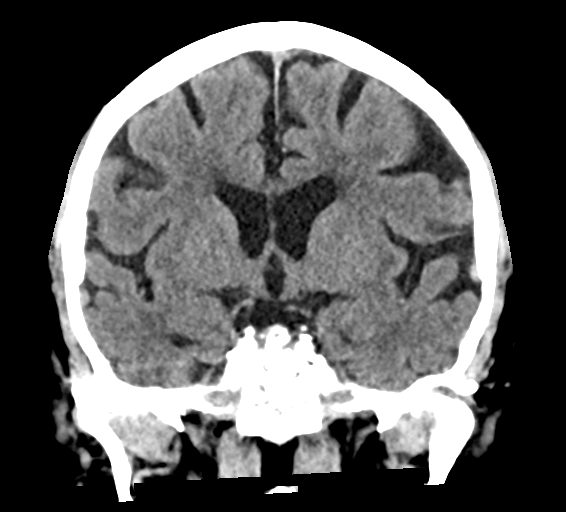
[im 39/70  brain]
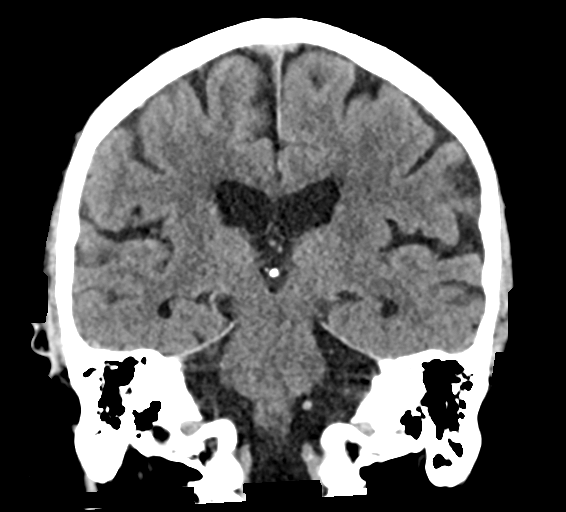

[Series 6: sag soft · sagittal · 0.34mm/px · 3 of 67 slices shown]
[im 23/67  brain]
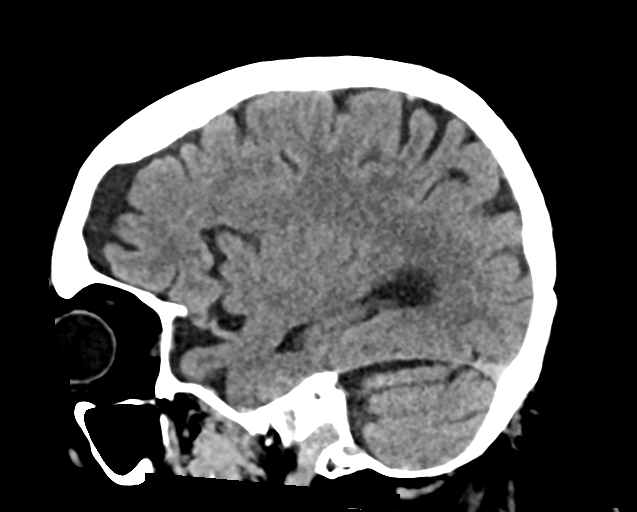
[im 34/67  brain]
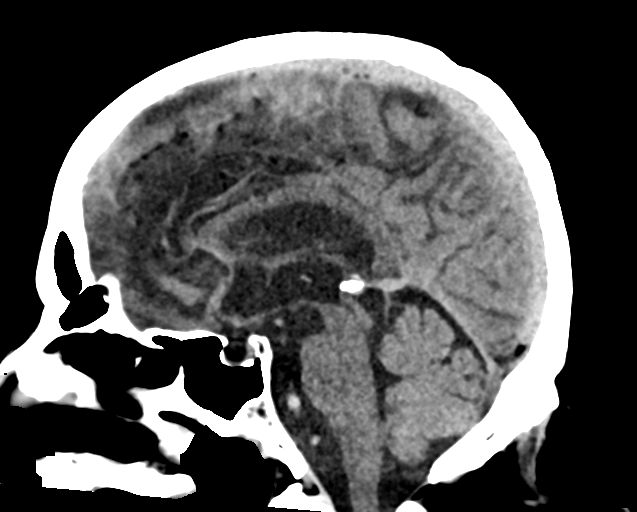
[im 45/67  brain]
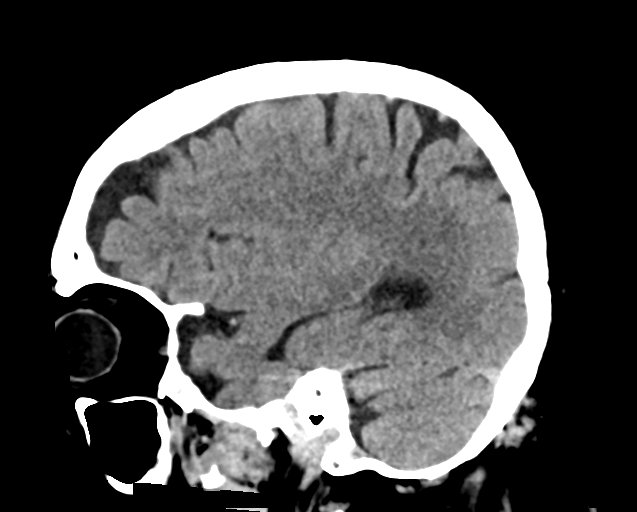

[16 of 47 positions shown; findings below may reference images not displayed]

FINDINGS: Brain: There is no mass, hemorrhage or extra-axial collection. The
size and configuration of the ventricles and extra-axial CSF spaces
are normal. There is hypoattenuation of the periventricular white
matter, most commonly indicating chronic ischemic microangiopathy.

Vascular: No abnormal hyperdensity of the major intracranial
arteries or dural venous sinuses. No intracranial atherosclerosis.

Skull: The visualized skull base, calvarium and extracranial soft
tissues are normal.

Sinuses/Orbits: No fluid levels or advanced mucosal thickening of
the visualized paranasal sinuses. No mastoid or middle ear effusion.
The orbits are normal.

ASPECTS (Alberta Stroke Program Early CT Score)

- Ganglionic level infarction (caudate, lentiform nuclei, internal
capsule, insula, M1-M3 cortex): 7

- Supraganglionic infarction (M4-M6 cortex): 3

Total score (0-10 with 10 being normal): 10
IMPRESSION: 1. Chronic ischemic microangiopathy without acute intracranial
abnormality.
2. ASPECTS is 10.

These results were communicated to Dr. An Hank Djoyo Adi Negoro at [DATE] on
06/13/2021 by text page via the AMION messaging system.

## 2022-07-04 IMAGING — DX DG CHEST 1V PORT
2 series · 2 of 2 positions shown · non-contrast
Comparison: Chest radiograph dated 06/13/2021.

CLINICAL DATA: 70-year-old male with chest pain.

EXAM:
PORTABLE CHEST 1 VIEW

[chest ap (1 of 2)]
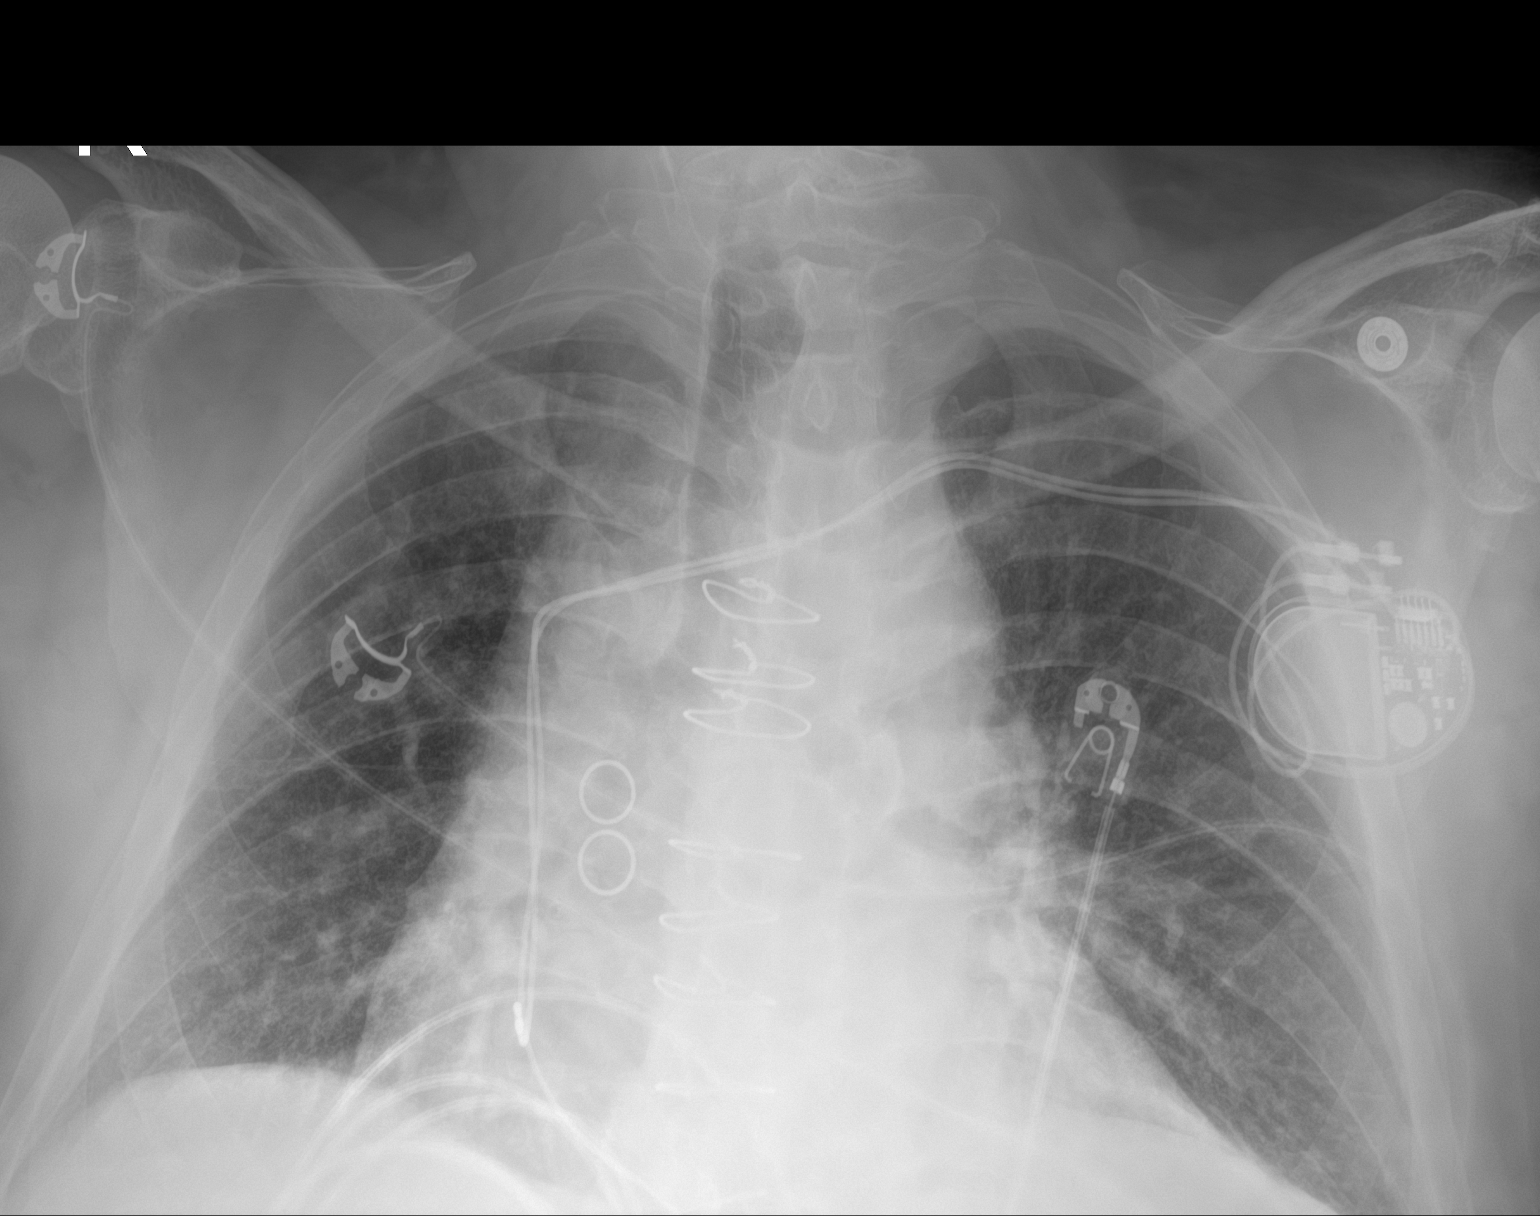

[chest ap (2 of 2)]
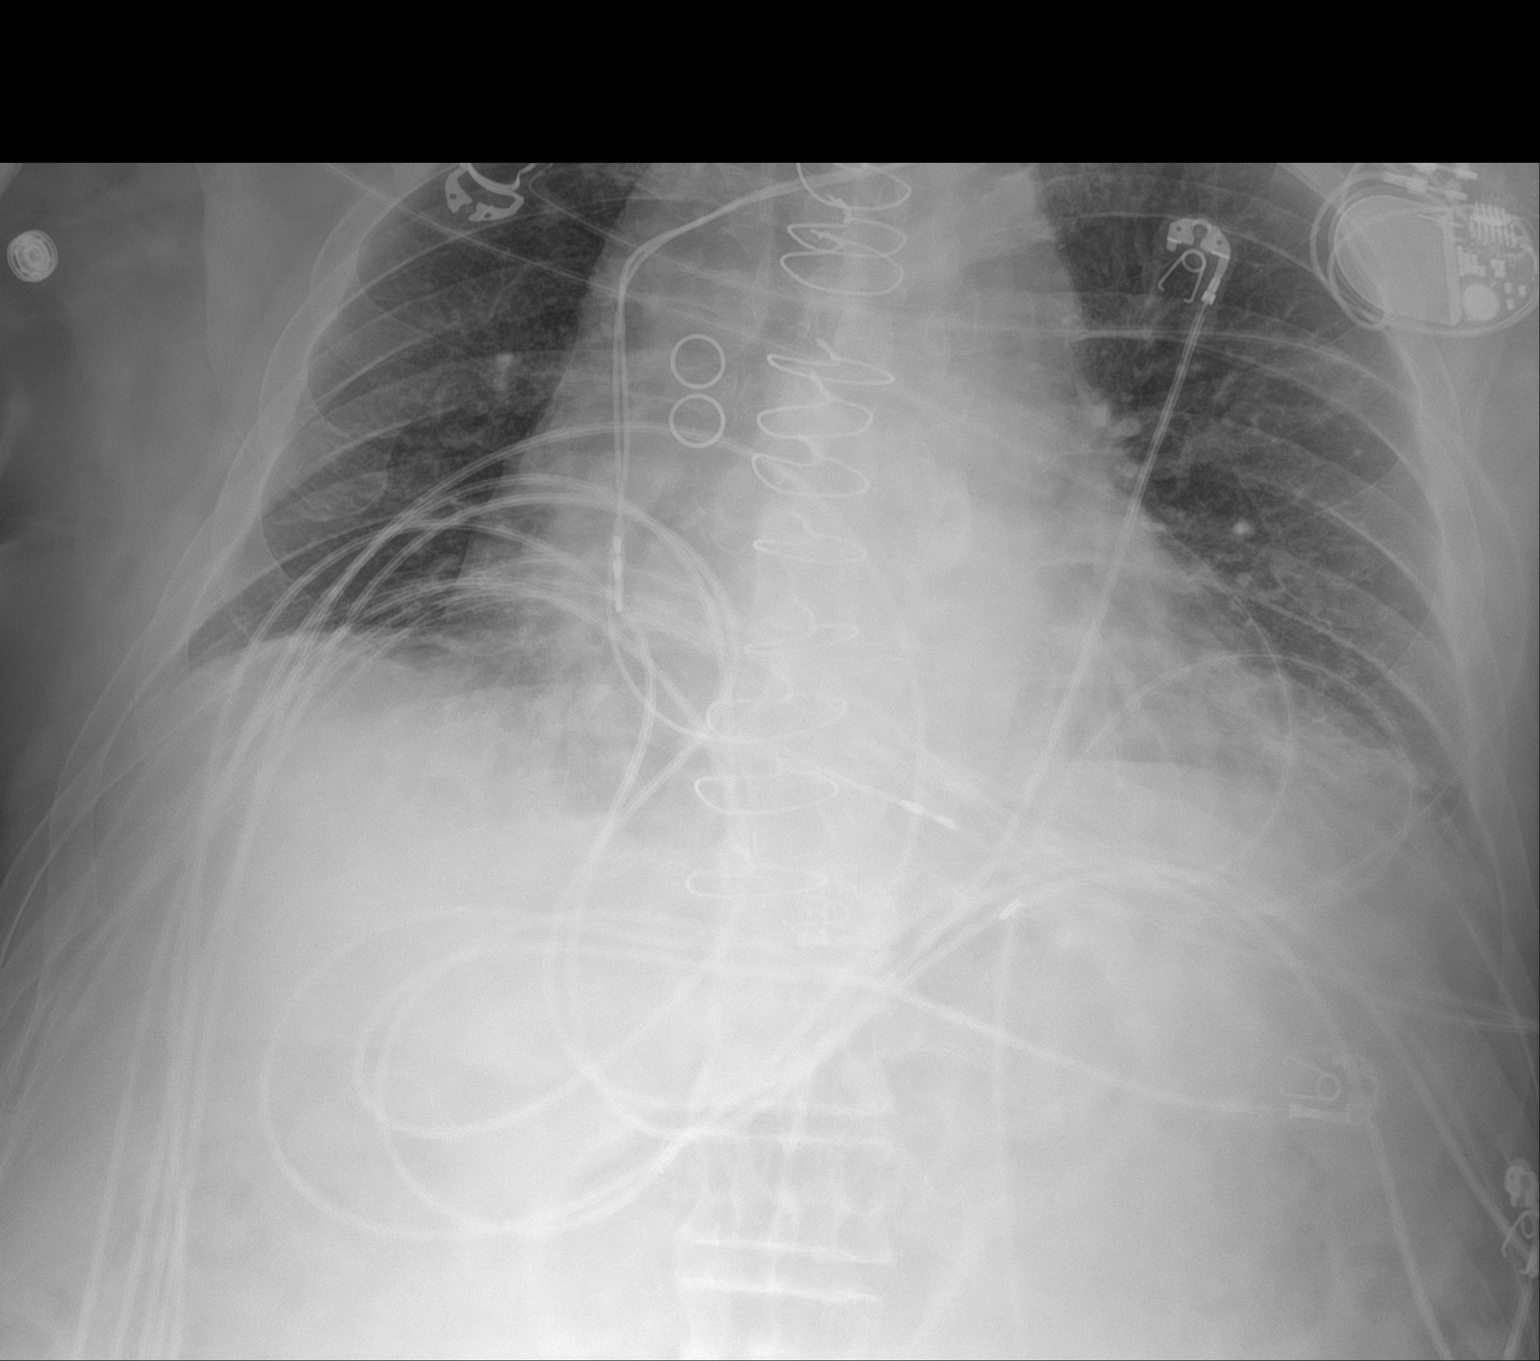

[2 of 2 positions shown; findings below may reference images not displayed]

FINDINGS: There is cardiomegaly with mild vascular congestion. Minimal
bibasilar atelectasis. No focal consolidation, pleural effusion, or
pneumothorax. Median sternotomy wires. Left pectoral pacemaker
device. No acute osseous pathology.
IMPRESSION: Cardiomegaly with mild vascular congestion.  No focal consolidation.

## 2022-07-07 IMAGING — CT CT HEAD W/O CM
3 series · 15 of 47 positions shown, 18 images · non-contrast
Comparison: CT head 06/13/2021

CLINICAL DATA: Stroke follow-up. Speech disturbance and left-sided
weakness

EXAM:
CT HEAD WITHOUT CONTRAST
TECHNIQUE: Contiguous axial images were obtained from the base of the skull
through the vertex without intravenous contrast.

[Series 3: head 5.0 h30s · axial · 0.47mm/px · z∈[-135,-10]mm · 9 of 30 slices shown, 12 images]
[im 3/30  brain]
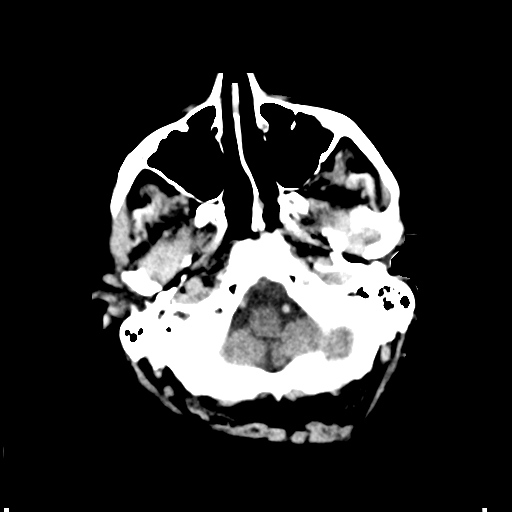
[im 3/30  bone]
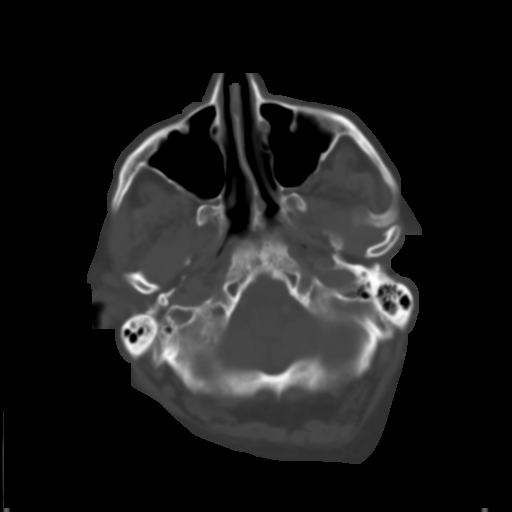
[im 6/30  brain]
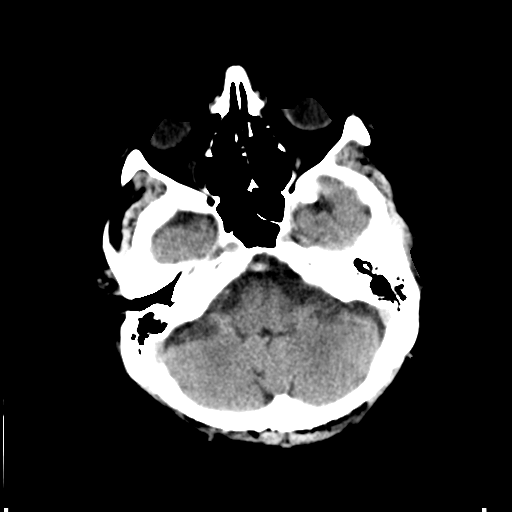
[im 9/30  brain]
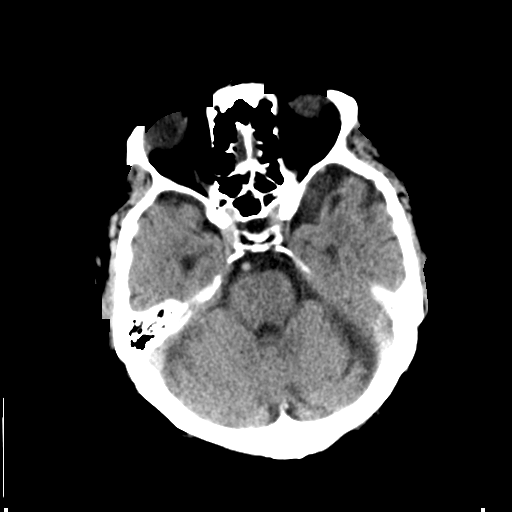
[im 12/30  brain]
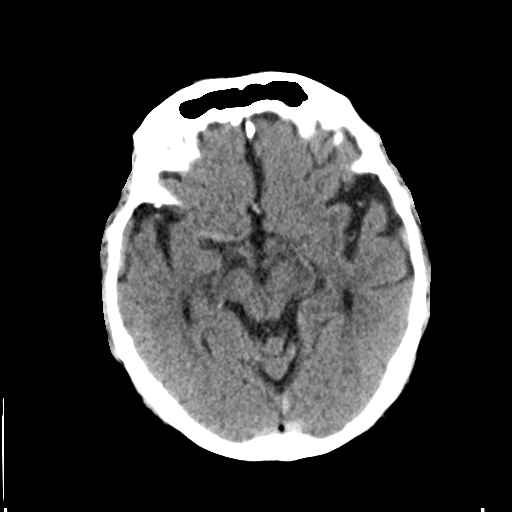
[im 16/30  brain]
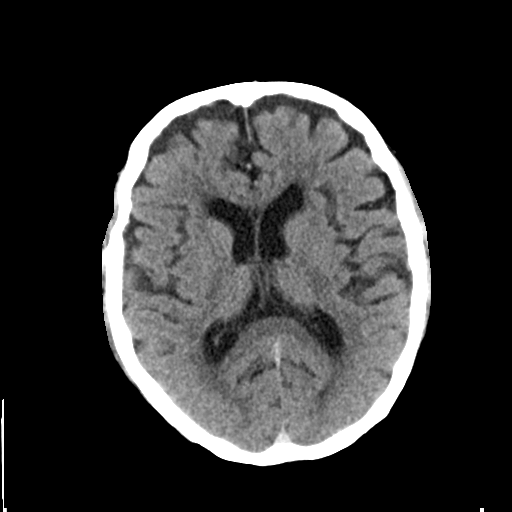
[im 16/30  bone]
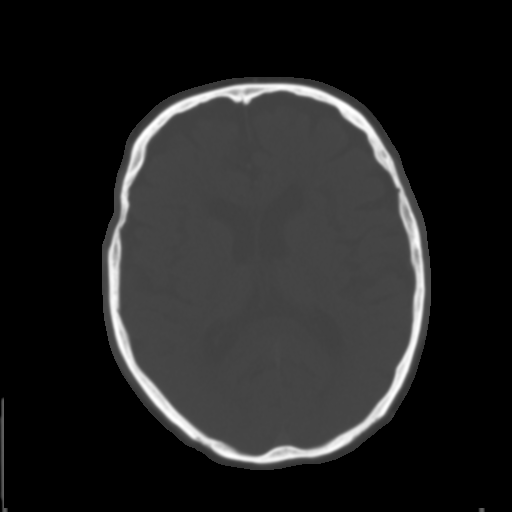
[im 19/30  brain]
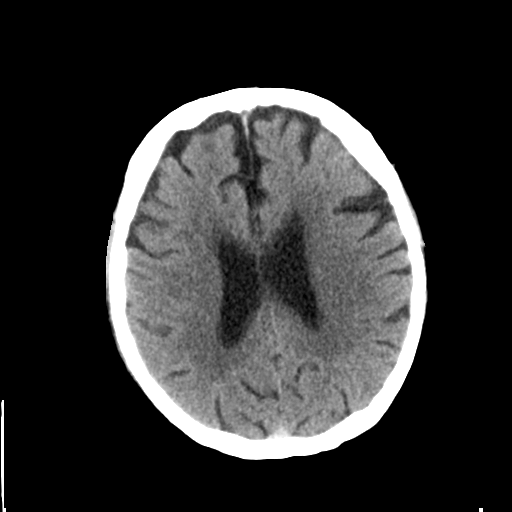
[im 22/30  brain]
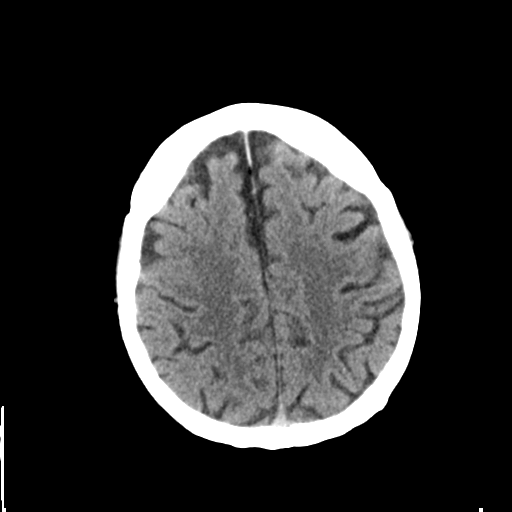
[im 25/30  brain]
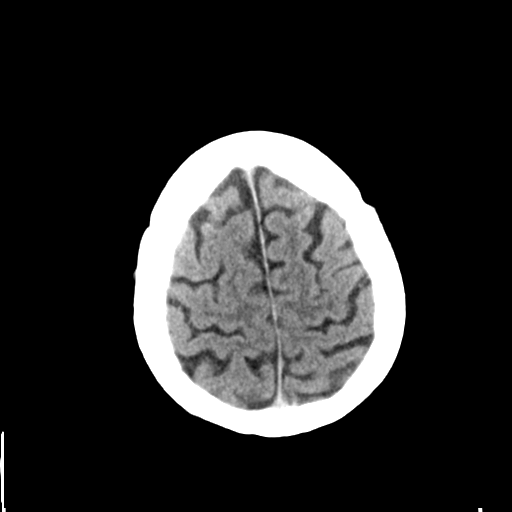
[im 28/30  brain]
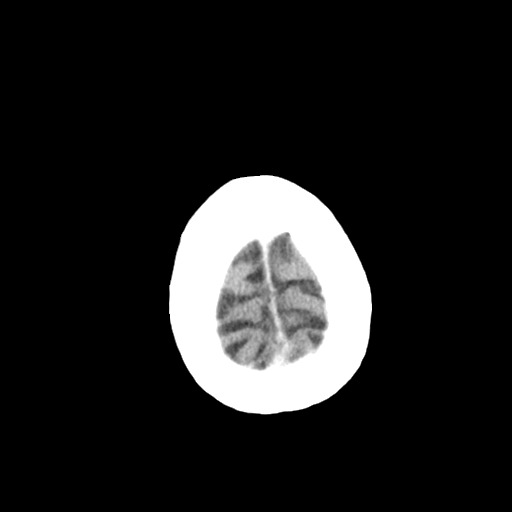
[im 28/30  bone]
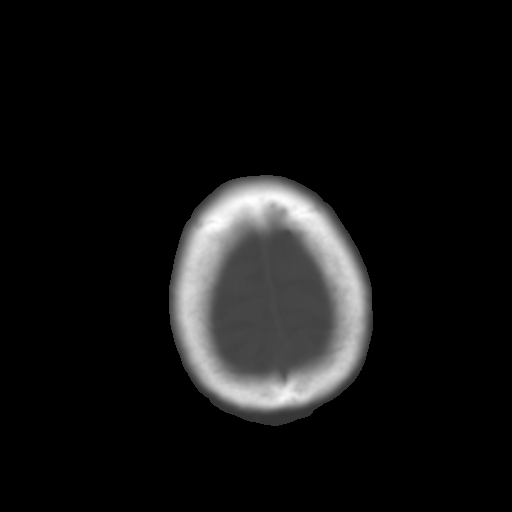

[Series 5: head 3.0 mpr cor · coronal · 0.33mm/px · 3 of 67 slices shown]
[im 23/67  brain]
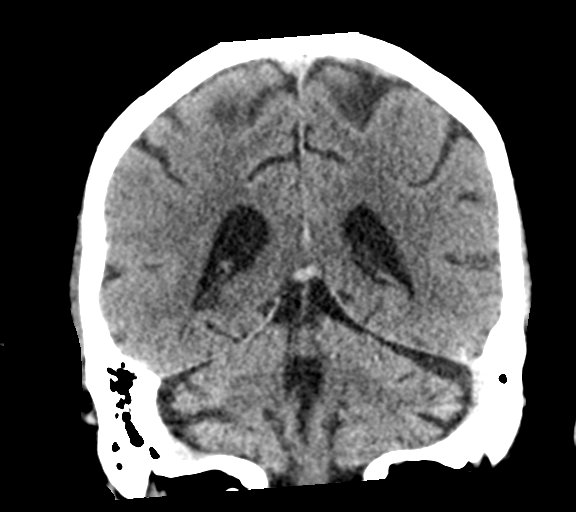
[im 30/67  brain]
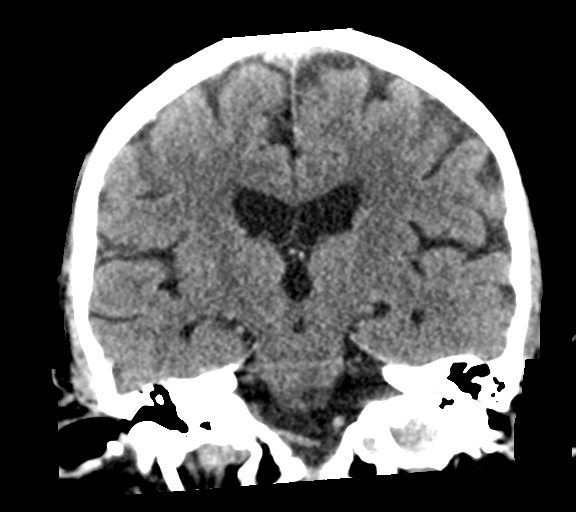
[im 37/67  brain]
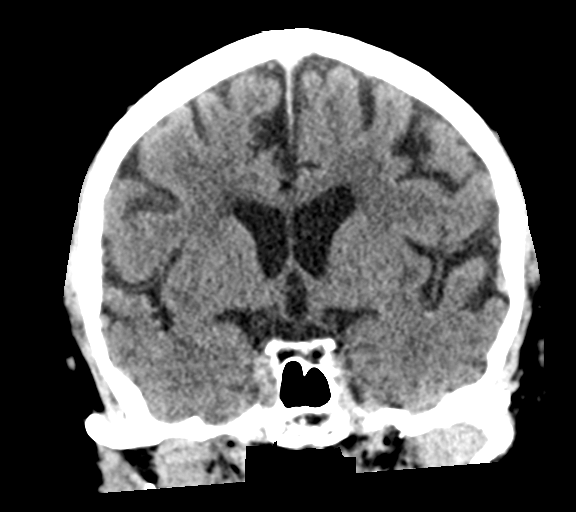

[Series 6: head 3.0 mpr sag · sagittal · 0.34mm/px · 3 of 67 slices shown]
[im 23/67  brain]
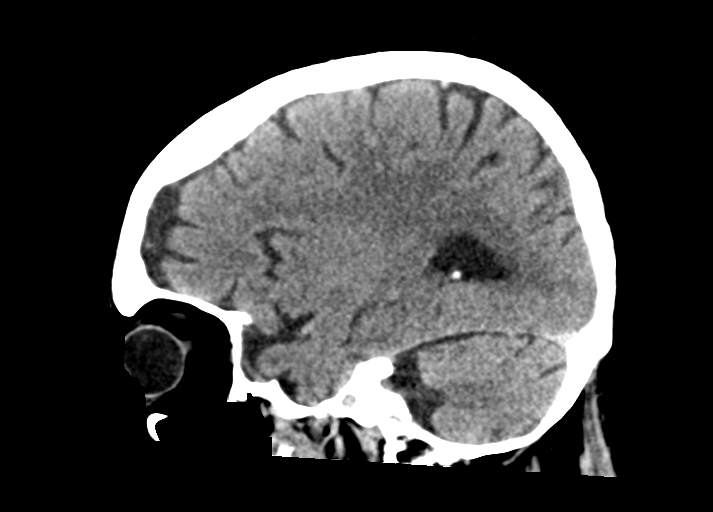
[im 34/67  brain]
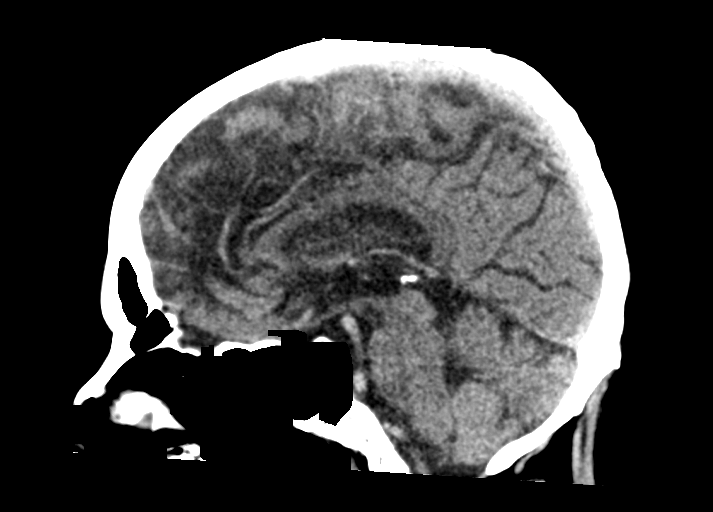
[im 45/67  brain]
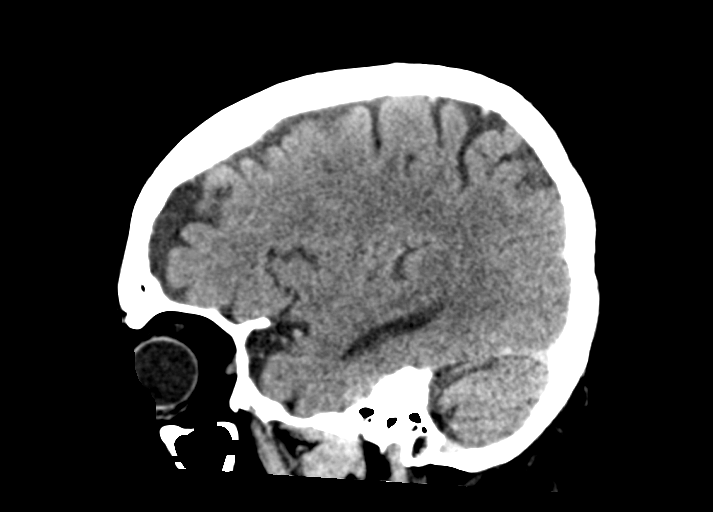

[15 of 47 positions shown; findings below may reference images not displayed]

FINDINGS: Brain: No evidence of acute infarction, hemorrhage, hydrocephalus,
extra-axial collection or mass lesion/mass effect.

Mild atrophy and mild periventricular white matter hypodensity
bilaterally.

Vascular: Negative for hyperdense vessel

Skull: Negative

Sinuses/Orbits: Paranasal sinuses clear. Bilateral cataract
extraction

Other: None
IMPRESSION: No acute abnormality. Mild atrophy and mild chronic microvascular
ischemic change in the white matter.
